# Patient Record
Sex: Male | Born: 1940 | Race: Black or African American | Hispanic: No | Marital: Married | Smoking: Former smoker
Health system: Southern US, Community
[De-identification: ages and names within clinical notes are randomized; demographics above are authoritative.]

## PROBLEM LIST (undated history)

## (undated) DIAGNOSIS — I499 Cardiac arrhythmia, unspecified: Secondary | ICD-10-CM

## (undated) DIAGNOSIS — I10 Essential (primary) hypertension: Secondary | ICD-10-CM

## (undated) DIAGNOSIS — E119 Type 2 diabetes mellitus without complications: Secondary | ICD-10-CM

## (undated) HISTORY — PX: CHOLECYSTECTOMY: SHX55

## (undated) HISTORY — PX: COLONOSCOPY: SHX174

---

## 2004-08-08 ENCOUNTER — Emergency Department: Payer: Self-pay | Admitting: Emergency Medicine

## 2007-05-30 ENCOUNTER — Ambulatory Visit: Payer: Self-pay | Admitting: Unknown Physician Specialty

## 2009-08-09 ENCOUNTER — Emergency Department: Payer: Self-pay | Admitting: Internal Medicine

## 2009-08-16 ENCOUNTER — Ambulatory Visit: Payer: Self-pay | Admitting: Internal Medicine

## 2012-01-16 ENCOUNTER — Ambulatory Visit: Payer: Self-pay | Admitting: Internal Medicine

## 2012-01-28 ENCOUNTER — Ambulatory Visit: Payer: Self-pay

## 2012-04-23 ENCOUNTER — Other Ambulatory Visit: Payer: Self-pay | Admitting: Neurosurgery

## 2012-04-23 DIAGNOSIS — M792 Neuralgia and neuritis, unspecified: Secondary | ICD-10-CM

## 2012-05-08 ENCOUNTER — Ambulatory Visit
Admission: RE | Admit: 2012-05-08 | Discharge: 2012-05-08 | Disposition: A | Discharge status: Home / Self Care | Payer: Medicare Other | Source: Ambulatory Visit | Attending: Neurosurgery | Admitting: Neurosurgery

## 2012-05-08 ENCOUNTER — Other Ambulatory Visit: Payer: Self-pay | Admitting: Neurosurgery

## 2012-05-08 ENCOUNTER — Inpatient Hospital Stay
Admission: RE | Admit: 2012-05-08 | Discharge: 2012-05-08 | Disposition: A | Discharge status: Home / Self Care | Payer: Self-pay | Source: Ambulatory Visit | Attending: Neurosurgery | Admitting: Neurosurgery

## 2012-05-08 VITALS — BP 111/67 | HR 76 | Ht 69.0 in | Wt 170.0 lb

## 2012-05-08 DIAGNOSIS — M792 Neuralgia and neuritis, unspecified: Secondary | ICD-10-CM

## 2012-05-08 DIAGNOSIS — R52 Pain, unspecified: Secondary | ICD-10-CM

## 2012-05-08 MED ORDER — IOHEXOL 180 MG/ML  SOLN
15.0000 mL | Freq: Once | INTRAMUSCULAR | Status: AC | PRN
  Administered 2012-05-08: 15 mL via INTRATHECAL

## 2012-05-08 MED ORDER — DIAZEPAM 5 MG PO TABS
5.0000 mg | ORAL_TABLET | Freq: Once | ORAL | Status: AC
  Administered 2012-05-08: 5 mg via ORAL

## 2012-05-08 NOTE — Progress Notes (Signed)
Resting quietly without complaint with wife at bedside.  Donell Sievert, RN

## 2014-03-06 ENCOUNTER — Emergency Department: Payer: Self-pay | Admitting: Student

## 2014-04-01 ENCOUNTER — Ambulatory Visit: Payer: Self-pay | Admitting: Gastroenterology

## 2015-05-17 ENCOUNTER — Ambulatory Visit
Admission: EM | Admit: 2015-05-17 | Discharge: 2015-05-17 | Disposition: A | Discharge status: Home / Self Care | Payer: Medicare Other | Attending: Family Medicine | Admitting: Family Medicine

## 2015-05-17 ENCOUNTER — Encounter: Payer: Self-pay | Admitting: Emergency Medicine

## 2015-05-17 DIAGNOSIS — B029 Zoster without complications: Secondary | ICD-10-CM

## 2015-05-17 MED ORDER — MUPIROCIN 2 % EX OINT: 1.0000 "application " | TOPICAL_OINTMENT | Freq: Three times a day (TID) | CUTANEOUS | Status: DC

## 2015-05-17 MED ORDER — FAMCICLOVIR 500 MG PO TABS: 500.0000 mg | ORAL_TABLET | Freq: Three times a day (TID) | ORAL | Status: DC

## 2015-05-17 NOTE — ED Notes (Signed)
Pt reports since last wed. And redness on right side of face started in hairline, and now down temporal area near eye. Also woke up today with eye itching and drainage. Denies blurred vision. Took some benadryl .

## 2015-05-17 NOTE — ED Provider Notes (Signed)
CSN: [id number]     Arrival date & time 05/17/15  [id number] History   First MD Initiated Contact with Patient 05/17/15 [phone]     Chief Complaint  Patient presents with  . Eye Drainage   (Consider location/radiation/quality/duration/timing/severity/associated sxs/prior Treatment) HPI   This 74 year old male who presents with a six-day history of right sided redness started in his scalp and has now extended down into the temporal area near his eye and also on the medial canthus upper eyelid. States that it is not been overly painful but has had some itching with some drainage. He does not have any visual disturbances in his visual acuity today is normal. The eye does have some redness to the conjunctiva and he has complained of some matting when he awakes in the morning. He does have some crustiness over the vesicular lesions over his temple which is a large patch. Is no fever or chills.  History reviewed. No pertinent past medical history. History reviewed. No pertinent past surgical history. History reviewed. No pertinent family history. Social History  Substance Use Topics  . Smoking status: None  . Smokeless tobacco: None  . Alcohol Use: None    Review of Systems  Constitutional: Negative for fever, chills, activity change and fatigue.  HENT: Positive for facial swelling.   Eyes: Positive for discharge, redness and itching. Negative for photophobia and visual disturbance.  Skin: Positive for rash.  All other systems reviewed and are negative.   Allergies  Review of patient's allergies indicates no known allergies.  Home Medications   Prior to Admission medications   Medication Sig Start Date End Date Taking? Authorizing Provider  Liraglutide (VICTOZA Elsie) Inject into the skin.   Yes Historical Provider, MD  LISINOPRIL PO Take by mouth.   Yes Historical Provider, MD  famciclovir (FAMVIR) 500 MG tablet Take 1 tablet (500 mg total) by mouth 3 (three) times daily. 05/17/15   Lutricia FeilWilliam P  Damean Poffenberger, PA-C  mupirocin ointment (BACTROBAN) 2 % Apply 1 application topically 3 (three) times daily. 05/17/15   Lutricia FeilWilliam P Anyiah Coverdale, PA-C   Meds Ordered and Administered this Visit  Medications - No data to display  BP 134/89 mmHg  Pulse 70  Temp(Src) 98.6 F (37 C) (Oral)  Resp 16  Ht 5\' 9"  (1.753 m)  Wt 180 lb (81.647 kg)  BMI 26.57 kg/m2  SpO2 99% No data found.   Physical Exam  Constitutional: He is oriented to person, place, and time. He appears well-developed and well-nourished. No distress.  HENT:  Head: Normocephalic and atraumatic.  Right Ear: External ear normal.  Left Ear: External ear normal.  Examination of the face shows a vesicular rash with erythema on the base to near the temporal patch starting from his scalp. Midline over the right side of the hairline to the temporal area and over the lateral aspect of the eye over the medial canthus of the eye lid. No direct involvement seen involving the eye; the eye is bloodshot without any noticeable discharge. The patch of vesicular rash over the temporal area does have a very large erythematous base that and some honey-colored discharge of the vesicles probably indicating a secondary infection.  Eyes: Pupils are equal, round, and reactive to light.  Neck: Normal range of motion. Neck supple.  Pulmonary/Chest: Effort normal and breath sounds normal. No respiratory distress.  Musculoskeletal: Normal range of motion. He exhibits no edema or tenderness.  Neurological: He is alert and oriented to person, place, and time.  Skin: Skin  is warm and dry. He is not diaphoretic.  Psychiatric: He has a normal mood and affect. His behavior is normal. Judgment and thought content normal.  Nursing note and vitals reviewed.   ED Course  Procedures (including critical care time)  Labs Review Labs Reviewed - No data to display  Imaging Review No results found.   Visual Acuity Review  Right Eye Distance: 20/30 Left Eye  Distance: 20/40 Bilateral Distance: 20/30 -1  Right Eye Near:   Left Eye Near:    Bilateral Near:         MDM   1. Shingles    Discharge Medication List as of 05/17/2015  9:15 AM     Plan: 1. Test/x-ray results and diagnosis reviewed with patient 2. rx as per orders; risks, benefits, potential side effects reviewed with patient 3. Recommend supportive treatment with pain meds ( patient refused narcotic). I arranged to have the patient seen at [facility] at 10:00 am this AM. Decided to start the patient on Famvir and Bactroban ointment to the vesicles that have a secondary infection. I've advised him that he needs to follow-up with his primary care physician this week if at all possible  4. F/u prn if symptoms worsen or don't improve    Lutricia Feil, PA-C 05/17/15 [phone]

## 2016-04-25 ENCOUNTER — Telehealth: Payer: Self-pay | Admitting: Gastroenterology

## 2016-04-25 NOTE — Telephone Encounter (Signed)
colonoscopy

## 2016-04-26 NOTE — Telephone Encounter (Signed)
Patient returning your call regarding a colonoscopy °

## 2016-04-27 ENCOUNTER — Telehealth: Payer: Self-pay

## 2016-04-27 ENCOUNTER — Other Ambulatory Visit: Payer: Self-pay

## 2016-04-27 NOTE — Telephone Encounter (Signed)
Gastroenterology Pre-Procedure Review  Request Date:  Requesting Physician: Dr.   PATIENT REVIEW QUESTIONS: The patient responded to the following health history questions as indicated:    1. Are you having any GI issues? no 2. Do you have a personal history of Polyps? no 3. Do you have a family history of Colon Cancer or Polyps? yes (Father colon cancer) 4. Diabetes Mellitus? yes (type 2) 5. Joint replacements in the past 12 months?no 6. Major health problems in the past 3 months?no 7. Any artificial heart valves, MVP, or defibrillator?no    MEDICATIONS & ALLERGIES:    Patient reports the following regarding taking any anticoagulation/antiplatelet therapy:   Plavix, Coumadin, Eliquis, Xarelto, Lovenox, Pradaxa, Brilinta, or Effient? no Aspirin? no  Patient confirms/reports the following medications:  Current Outpatient Prescriptions  Medication Sig Dispense Refill  . famciclovir (FAMVIR) 500 MG tablet Take 1 tablet (500 mg total) by mouth 3 (three) times daily. 21 tablet 0  . Liraglutide (VICTOZA Kempton) Inject into the skin.    Marland Kitchen. LISINOPRIL PO Take by mouth.    . mupirocin ointment (BACTROBAN) 2 % Apply 1 application topically 3 (three) times daily. 22 g 0   No current facility-administered medications for this visit.     Patient confirms/reports the following allergies:  No Known Allergies  No orders of the defined types were placed in this encounter.   AUTHORIZATION INFORMATION Primary Insurance: 1D#: Group #:  Secondary Insurance: 1D#: Group #:  SCHEDULE INFORMATION: Date: 05/17/16 Time: Location: [facility]

## 2016-04-27 NOTE — Telephone Encounter (Signed)
[facility]ohl.   Screening Z12.11 and family hx of colon cancer Z80.0  Please precert.

## 2016-05-01 NOTE — Telephone Encounter (Signed)
Patient has medicare. Pre cert is not required

## 2016-05-10 ENCOUNTER — Other Ambulatory Visit: Payer: Self-pay

## 2016-05-10 ENCOUNTER — Encounter: Payer: Self-pay | Admitting: *Deleted

## 2016-05-16 ENCOUNTER — Telehealth: Payer: Self-pay | Admitting: Gastroenterology

## 2016-05-16 NOTE — Telephone Encounter (Signed)
Patient needs to reschedule his procedure  

## 2016-05-16 NOTE — Telephone Encounter (Signed)
Funeral tomorrow so call Friday or next week to reschedule

## 2016-05-16 NOTE — Telephone Encounter (Signed)
Contacted pt and rescheduled colonoscopy to 06/01/16. Left vm with [facility] for this appt to be rescheduled to new date.

## 2016-05-31 NOTE — Discharge Instructions (Signed)

## 2016-06-01 ENCOUNTER — Ambulatory Visit: Payer: Medicare HMO | Admitting: Student in an Organized Health Care Education/Training Program

## 2016-06-01 ENCOUNTER — Ambulatory Visit
Admission: RE | Admit: 2016-06-01 | Discharge: 2016-06-01 | Disposition: A | Discharge status: Home / Self Care | Payer: Medicare HMO | Source: Ambulatory Visit | Attending: Gastroenterology | Admitting: Gastroenterology

## 2016-06-01 ENCOUNTER — Encounter
Admission: RE | Disposition: A | Discharge status: Home / Self Care | Payer: Self-pay | Source: Ambulatory Visit | Attending: Gastroenterology

## 2016-06-01 DIAGNOSIS — Z79899 Other long term (current) drug therapy: Secondary | ICD-10-CM | POA: Diagnosis not present

## 2016-06-01 DIAGNOSIS — Z87891 Personal history of nicotine dependence: Secondary | ICD-10-CM | POA: Diagnosis not present

## 2016-06-01 DIAGNOSIS — E119 Type 2 diabetes mellitus without complications: Secondary | ICD-10-CM | POA: Insufficient documentation

## 2016-06-01 DIAGNOSIS — K5730 Diverticulosis of large intestine without perforation or abscess without bleeding: Secondary | ICD-10-CM | POA: Diagnosis not present

## 2016-06-01 DIAGNOSIS — I10 Essential (primary) hypertension: Secondary | ICD-10-CM | POA: Diagnosis not present

## 2016-06-01 DIAGNOSIS — Z7984 Long term (current) use of oral hypoglycemic drugs: Secondary | ICD-10-CM | POA: Diagnosis not present

## 2016-06-01 DIAGNOSIS — Z1211 Encounter for screening for malignant neoplasm of colon: Secondary | ICD-10-CM

## 2016-06-01 DIAGNOSIS — K641 Second degree hemorrhoids: Secondary | ICD-10-CM | POA: Diagnosis not present

## 2016-06-01 HISTORY — DX: Cardiac arrhythmia, unspecified: I49.9

## 2016-06-01 HISTORY — DX: Essential (primary) hypertension: I10

## 2016-06-01 HISTORY — PX: COLONOSCOPY WITH PROPOFOL: SHX5780

## 2016-06-01 HISTORY — DX: Type 2 diabetes mellitus without complications: E11.9

## 2016-06-01 LAB — GLUCOSE, CAPILLARY
GLUCOSE-CAPILLARY: 109 mg/dL — AB (ref 65–99)
GLUCOSE-CAPILLARY: 108 mg/dL — AB (ref 65–99)

## 2016-06-01 SURGERY — COLONOSCOPY WITH PROPOFOL
Anesthesia: Monitor Anesthesia Care | Wound class: Contaminated

## 2016-06-01 MED ORDER — LIDOCAINE HCL (CARDIAC) 20 MG/ML IV SOLN
INTRAVENOUS | Status: DC | PRN
  Administered 2016-06-01: 40 mg via INTRAVENOUS

## 2016-06-01 MED ORDER — OXYCODONE HCL 5 MG PO TABS: 5.0000 mg | ORAL_TABLET | Freq: Once | ORAL | Status: DC | PRN

## 2016-06-01 MED ORDER — OXYCODONE HCL 5 MG/5ML PO SOLN
5.0000 mg | Freq: Once | ORAL | Status: DC | PRN
Start: 2016-06-01 — End: 2016-06-01

## 2016-06-01 MED ORDER — PROPOFOL 10 MG/ML IV BOLUS
INTRAVENOUS | Status: DC | PRN
  Administered 2016-06-01: 100 mg via INTRAVENOUS
  Administered 2016-06-01 (×2): 50 mg via INTRAVENOUS

## 2016-06-01 MED ORDER — LACTATED RINGERS IV SOLN
INTRAVENOUS | Status: DC
  Administered 2016-06-01: 08:00:00 via INTRAVENOUS

## 2016-06-01 MED ORDER — STERILE WATER FOR IRRIGATION IR SOLN
Status: DC | PRN
  Administered 2016-06-01: 08:00:00

## 2016-06-01 SURGICAL SUPPLY — 23 items

## 2016-06-01 NOTE — Anesthesia Preprocedure Evaluation (Signed)
Anesthesia Evaluation  Patient identified by MRN, date of birth, ID band Patient awake    Reviewed: Allergy & Precautions, H&P , NPO status , Patient's Chart, lab work & pertinent test results  Airway Mallampati: II  TM Distance: >3 FB Neck ROM: full    Dental   Pulmonary former smoker,    Pulmonary exam normal        Cardiovascular hypertension, Normal cardiovascular exam     Neuro/Psych    GI/Hepatic negative GI ROS, Neg liver ROS,   Endo/Other  diabetes  Renal/GU negative Renal ROS     Musculoskeletal   Abdominal   Peds  Hematology negative hematology ROS (+)   Anesthesia Other Findings   Reproductive/Obstetrics negative OB ROS                             Anesthesia Physical Anesthesia Plan  ASA: II  Anesthesia Plan: MAC   Post-op Pain Management:    Induction:   Airway Management Planned:   Additional Equipment:   Intra-op Plan:   Post-operative Plan:   Informed Consent:   Plan Discussed with:   Anesthesia Plan Comments:         Anesthesia Quick Evaluation

## 2016-06-01 NOTE — H&P (Signed)
  Midge Miniumarren Pax Reasoner, MD [facility] [address] [place], [place] [address] Phone: [phone] Fax : [phone]  Primary Care Physician:  No primary care provider on file. Primary Gastroenterologist:  Dr. Servando SnareWohl  Pre-Procedure History & Physical: HPI:  Jordan Jennings is a 76 y.o. male is here for a screening colonoscopy.   Past Medical History:  Diagnosis Date  . Diabetes mellitus without complication (HCC)   . 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 Hypertension     Past Surgical History:  Procedure Laterality Date  . CHOLECYSTECTOMY    . COLONOSCOPY      Prior to Admission medications   Medication Sig Start Date End Date Taking? Authorizing Provider  glipiZIDE (GLUCOTROL XL) 5 MG 24 hr tablet 2 (two) times daily.  04/13/16  Yes Historical Provider, MD  Insulin Degludec (TRESIBA FLEXTOUCH Kirvin) Inject into the skin daily.   Yes Historical Provider, MD  lisinopril-hydrochlorothiazide (PRINZIDE,ZESTORETIC) 20-25 MG tablet Take 1 tablet by mouth daily.   Yes Historical Provider, MD  PROPRANOLOL HCL PO Take 10 mg by mouth 3 (three) times daily.    Yes Historical Provider, MD    Allergies as of 04/27/2016  . (No Known Allergies)    History reviewed. No pertinent family history.  Social History   Social History  . Marital status: Married    Spouse name: N/A  . Number of children: N/A  . Years of education: N/A   Occupational History  . Not on file.   Social History Main Topics  . Smoking status: Former Games developermoker  . Smokeless tobacco: Never Used     Comment: as teenager  . Alcohol use Yes     Comment: Holidays  . Drug use: Unknown  . Sexual activity: Not on file   Other Topics Concern  . Not on file   Social History Narrative  . No narrative on file    Review of Systems: See HPI, otherwise negative ROS  Physical Exam: BP (!) 141/94   Pulse 80   Temp 97 F (36.1 C) (Temporal)   Ht 5\' 9"  (1.753 m)   Wt 184 lb (83.5 kg)   SpO2 99%   BMI  27.17 kg/m  General:   Alert,  pleasant and cooperative in NAD Head:  Normocephalic and atraumatic. Neck:  Supple; no masses or thyromegaly. Lungs:  Clear throughout to auscultation.    Heart:  Regular rate and rhythm. Abdomen:  Soft, nontender and nondistended. Normal bowel sounds, without guarding, and without rebound.   Neurologic:  Alert and  oriented x4;  grossly normal neurologically.  Impression/Plan: Jordan Jennings is now here to undergo a screening colonoscopy.  Risks, benefits, and alternatives regarding colonoscopy have been reviewed with the patient.  Questions have been answered.  All parties agreeable.

## 2016-06-01 NOTE — Op Note (Addendum)
[facility] Gastroenterology Patient Name: Jordan BourbonLorenza Jennings Procedure Date: 06/01/2016 7:53 AM MRN: [id number] Account #: [id number] Date of Birth: 03/27/1941 Admit Type: Outpatient Age: 76 [facility] Gender: Male Note Status: Finalized Procedure:            Colonoscopy Indications:          Screening for colorectal malignant neoplasm Providers:            Midge Miniumarren Yeimi Debnam MD, MD Referring MD:         Sherrie MustacheFayegh Jadali, MD (Referring MD) Medicines:            Propofol per Anesthesia Complications:        No immediate complications. Procedure:            Pre-Anesthesia Assessment:                       - Prior to the procedure, a History and Physical was                        performed, and patient medications and allergies were                        reviewed. The patient's tolerance of previous                        anesthesia was also reviewed. The risks and benefits of                        the procedure and the sedation options and risks were                        discussed with the patient. All questions were                        answered, and informed consent was obtained. Prior                        Anticoagulants: The patient has taken no previous                        anticoagulant or antiplatelet agents. ASA Grade                        Assessment: II - A patient with mild systemic disease.                        After reviewing the risks and benefits, the patient was                        deemed in satisfactory condition to undergo the                        procedure.                       After obtaining informed consent, the colonoscope was                        passed under direct vision. Throughout the procedure,  the patient's blood pressure, pulse, and oxygen                        saturations were monitored continuously. The Olympus CF                        H180AL colonoscope (S#: [id number]) was introduced through                       the anus and advanced to the the cecum, identified by                        appendiceal orifice and ileocecal valve. The                        colonoscopy was performed without difficulty. The                        patient tolerated the procedure well. The quality of                        the bowel preparation was good. Findings:      The perianal and digital rectal examinations were normal.      Multiple small-mouthed diverticula were found in the entire colon.      Non-bleeding internal hemorrhoids were found during retroflexion. The       hemorrhoids were Grade II (internal hemorrhoids that prolapse but reduce       spontaneously). Impression:           - Diverticulosis in the entire examined colon.                       - Non-bleeding internal hemorrhoids.                       - No specimens collected. Recommendation:       - Discharge patient to home.                       - Resume previous diet.                       - Continue present medications.                       - Repeat colonoscopy in 10 years for screening unless                        any change in family history or lower GI problems. Procedure Code(s):    --- Professional ---                       [phone], Colonoscopy, flexible; diagnostic, including                        collection of specimen(s) by brushing or washing, when                        performed (separate procedure) Diagnosis Code(s):    --- Professional ---                       Z12.11,  Encounter for screening for malignant neoplasm                        of colon CPT copyright 2016 American Medical Association. All rights reserved. The codes documented in this report are preliminary and upon coder review may  be revised to meet current compliance requirements. Midge Minium MD, MD 06/01/2016 8:13:21 AM This report has been signed electronically. Number of Addenda: 0 Note Initiated On: 06/01/2016 7:53 AM Scope Withdrawal Time: 0  hours 7 minutes 14 seconds  Total Procedure Duration: 0 hours 8 minutes 31 seconds       [facility]

## 2016-06-01 NOTE — Anesthesia Postprocedure Evaluation (Signed)
Anesthesia Post Note  Patient: Jordan Jennings  Procedure(s) Performed: Procedure(s) (LRB): COLONOSCOPY WITH PROPOFOL (N/A)  Patient location during evaluation: PACU Anesthesia Type: MAC Level of consciousness: awake Pain management: pain level controlled Vital Signs Assessment: post-procedure vital signs reviewed and stable Respiratory status: spontaneous breathing Cardiovascular status: blood pressure returned to baseline Postop Assessment: no headache Anesthetic complications: no    Jaci Standard, III,  Kingjames Coury D

## 2016-06-01 NOTE — Anesthesia Procedure Notes (Signed)
Procedure Name: MAC Date/Time: 06/01/2016 7:56 AM Performed by: Janna Arch Pre-anesthesia Checklist: Patient identified, Emergency Drugs available, Suction available and Patient being monitored Patient Re-evaluated:Patient Re-evaluated prior to inductionOxygen Delivery Method: Nasal cannula

## 2016-06-01 NOTE — Transfer of Care (Signed)
Immediate Anesthesia Transfer of Care Note  Patient: Jordan Jennings  Procedure(s) Performed: Procedure(s) with comments: COLONOSCOPY WITH PROPOFOL (N/A) - Diabetic  Patient Location: PACU  Anesthesia Type: MAC  Level of Consciousness: awake, alert  and patient cooperative  Airway and Oxygen Therapy: Patient Spontanous Breathing and Patient connected to supplemental oxygen  Post-op Assessment: Post-op Vital signs reviewed, Patient's Cardiovascular Status Stable, Respiratory Function Stable, Patent Airway and No signs of Nausea or vomiting  Post-op Vital Signs: Reviewed and stable  Complications: No apparent anesthesia complications

## 2016-06-04 ENCOUNTER — Encounter: Payer: Self-pay | Admitting: Gastroenterology

## 2019-05-05 ENCOUNTER — Other Ambulatory Visit: Payer: Self-pay | Admitting: Physician Assistant

## 2019-05-05 DIAGNOSIS — M5440 Lumbago with sciatica, unspecified side: Secondary | ICD-10-CM

## 2019-05-07 ENCOUNTER — Other Ambulatory Visit: Payer: Self-pay

## 2019-05-07 ENCOUNTER — Encounter: Payer: Self-pay | Admitting: Emergency Medicine

## 2019-05-07 ENCOUNTER — Ambulatory Visit
Admission: EM | Admit: 2019-05-07 | Discharge: 2019-05-07 | Disposition: A | Discharge status: Home / Self Care | Payer: Medicare HMO | Attending: Family Medicine | Admitting: Family Medicine

## 2019-05-07 DIAGNOSIS — R058 Other specified cough: Secondary | ICD-10-CM

## 2019-05-07 DIAGNOSIS — Z87891 Personal history of nicotine dependence: Secondary | ICD-10-CM | POA: Diagnosis not present

## 2019-05-07 DIAGNOSIS — Z20828 Contact with and (suspected) exposure to other viral communicable diseases: Secondary | ICD-10-CM

## 2019-05-07 DIAGNOSIS — R05 Cough: Secondary | ICD-10-CM

## 2019-05-07 DIAGNOSIS — Z20822 Contact with and (suspected) exposure to covid-19: Secondary | ICD-10-CM

## 2019-05-07 DIAGNOSIS — Z7189 Other specified counseling: Secondary | ICD-10-CM

## 2019-05-07 NOTE — Discharge Instructions (Addendum)
It was very nice seeing you today in clinic. Thank you for entrusting me with your care.   Rest and stay HYDRATED. Water and electrolyte containing beverages (Gatorade, Pedialyte) are best to prevent dehydration and electrolyte abnormalities.   May use Tylenol and/or Ibuprofen as needed for pain/fever.   Continue over the counter cough syrup and daily allergy medication.   You were tested for SARS-CoV-2 (novel coronavirus) today. Testing is performed by an outside lab ([facility]) and has variable turn around times ranging between 2-5 days. Current recommendations from the the [facility] and [facility] DHHS require that you remain at home until negative test results are have been received. In the event that your test results are positive, you will be contacted with further directives. These measures are being implemented out of an abundance of caution to prevent transmission and spread during the current SARS-CoV-2 pandemic.  Make arrangements to follow up with your regular doctor in 1 week for re-evaluation if not improving. If your symptoms/condition worsens, please seek follow up care either here or in the ER. Please remember, our [facility] providers are "right here with you" when you need [place].   Again, it was my pleasure to take care of you today. Thank you for choosing our clinic. I hope that you start to feel better quickly.   Honor Loh, MSN, APRN, FNP-C, CEN Advanced Practice Provider [facility]

## 2019-05-07 NOTE — ED Triage Notes (Signed)
Pt c/o cough. Started about 2 weeks ago. He states that he seems to be getting better but his wife tested positive for covid 4 days ago.

## 2019-05-07 NOTE — ED Provider Notes (Addendum)
Jordan Jennings, Jordan Jennings   Name: Jordan OrLorenza R Dittmar DOB: 08/13/1940 MRN: [id number] CSN: [id number] PCP: Patient, No Pcp Per  Arrival date and time:  05/07/19 [id number]  Chief Complaint:  Cough and COVID exposure   NOTE: Prior to seeing the patient today, I have reviewed the triage nursing documentation and vital signs. Clinical staff has updated patient's PMH/PSHx, current medication list, and drug allergies/intolerances to ensure comprehensive history available to assist in medical decision making.   History:   HPI: Jordan Jennings is a 78 y.o. male who presents today with complaints of cough and post nasal drip that started approximately 2 weeks ago. Patient denies fevers. Cough is intermittently productive of clear sputum. He denies any shortness of breath or wheezing. PMH (+) for seasonal allergic rhinitis; takes daily allergy medication (unknown medication). He denies that he has experienced any nausea, vomiting, diarrhea, or abdominal pain. He is eating and drinking well. Patient denies any perceived alterations to his sense of taste or smell. Patient presents out of concerns for his personal health following his his wife testing positive for SARS-CoV-2 (novel coronavirus) on Monday. Patient reports that he felt like his cough has improved since its onset, however he just wants to make sure that he does not have SARS-CoV-2. He has never been tested for SARS-CoV-2 (novel coronavirus) in the past per his report. Patient has been vaccinated for influenza this season. In efforts to conservatively manage his symptoms at home, the patient notes that he has used Robitussin, which has helped to improve his symptoms.   Past Medical History:  Diagnosis Date  . Diabetes mellitus without complication (HCC)   . 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 Hypertension     Past Surgical History:  Procedure Laterality Date  . CHOLECYSTECTOMY    . COLONOSCOPY    . COLONOSCOPY WITH PROPOFOL N/A 06/01/2016   Procedure: COLONOSCOPY WITH PROPOFOL;  Surgeon: Midge Miniumarren Wohl, MD;  [facility];  Service: Endoscopy;  Laterality: N/A;  Diabetic    History reviewed. No pertinent family history.  Social History   Tobacco Use  . Smoking status: Former Games developermoker  . Smokeless tobacco: Never Used  . Tobacco comment: as teenager  Substance Use Topics  . Alcohol use: Yes    Comment: Holidays  . Drug use: Not on file    Patient Active Problem List   Diagnosis Date Noted  . Special screening for malignant neoplasms, colon     Home Medications:    Current Meds  Medication Sig  . glipiZIDE (GLUCOTROL XL) 5 MG 24 hr tablet 2 (two) times daily.   . Insulin Degludec (TRESIBA FLEXTOUCH [place]) Inject into the skin daily.  Marland Kitchen. lisinopril-hydrochlorothiazide (PRINZIDE,ZESTORETIC) 20-25 MG tablet Take 1 tablet by mouth daily.  Marland Kitchen. PROPRANOLOL HCL PO Take 10 mg by mouth 3 (three) times daily.   . Vitamin D, Ergocalciferol, (DRISDOL) 1.25 MG (50000 UT) CAPS capsule Take 50,000 Units by mouth every 7 (seven) days.    Allergies:   Patient has no known allergies.  Review of Systems (ROS): Review of Systems  Constitutional: Negative for fatigue and fever.  HENT: Positive for postnasal drip. Negative for congestion, ear pain, rhinorrhea, sinus pressure, sinus pain, sneezing and sore throat.   Eyes: Negative for pain, discharge and redness.  Respiratory: Positive for cough. Negative for chest tightness, shortness of breath, wheezing and stridor.   Cardiovascular: Negative for chest pain and palpitations.  Gastrointestinal: Negative for abdominal pain, diarrhea, nausea and vomiting.  Musculoskeletal: Negative for arthralgias, back pain, myalgias and neck pain.  Skin: Negative for color change, pallor and rash.  Allergic/Immunologic: Positive for environmental allergies (seasonal).  Neurological: Negative for dizziness, syncope, weakness and headaches.  Hematological: Negative for adenopathy.     Vital  Signs: Today's Vitals   05/07/19 0956 05/07/19 0957 05/07/19 1001  BP:   (!) 150/90  Pulse:   (!) 59  Resp:   18  Temp:   98.2 F (36.8 C)  TempSrc:   Oral  SpO2:   98%  Weight:  193 lb (87.5 kg)   Height:  5\' 9"  (1.753 m)   PainSc: 0-No pain      Physical Exam: Physical Exam  Constitutional: He is oriented to person, place, and time and well-developed, well-nourished, and in no distress.  HENT:  Head: Normocephalic and atraumatic.  Right Ear: Tympanic membrane normal.  Left Ear: Tympanic membrane normal.  Nose: Nose normal.  Mouth/Throat: Uvula is midline. Posterior oropharyngeal erythema (mild with (+) clear PND) present. No oropharyngeal exudate.  Eyes: Pupils are equal, round, and reactive to light.  Cardiovascular: Normal rate, regular rhythm, normal heart sounds and intact distal pulses.  Pulmonary/Chest: Effort normal and breath sounds normal.  Neurological: He is alert and oriented to person, place, and time. Gait normal.  Skin: Skin is warm and dry. No rash noted. He is not diaphoretic.  Psychiatric: Mood, memory, affect and judgment normal.  Nursing note and vitals reviewed.   Urgent Care Treatments / Results:  LABS: PLEASE NOTE: all labs that were ordered this encounter are listed, however only abnormal results are displayed. Labs Reviewed  NOVEL CORONAVIRUS, NAA (HOSP ORDER, SEND-OUT TO REF LAB; TAT 18-24 HRS)    EKG: -None  RADIOLOGY: No results found.  PROCEDURES: Procedures  MEDICATIONS RECEIVED THIS VISIT: Medications - No data to display  PERTINENT CLINICAL COURSE NOTES/UPDATES:   Initial Impression / Assessment and Plan / Urgent Care Course:  Pertinent labs & imaging results that were available during my care of the patient were personally reviewed by me and considered in my medical decision making (see lab/imaging section of note for values and interpretations).  Jordan Jennings is a 78 y.o. male who presents to [facility] today with  complaints of Cough and COVID exposure   Patient overall well appearing and in no acute distress today in clinic. Presenting symptoms (see HPI) and exam as documented above. He presents with symptoms associated with SARS-CoV-2 (novel coronavirus). Discussed typical symptom constellation. Reviewed potential for infection and need for testing. Patient amenable to being tested. SARS-CoV-2 swab collected by certified clinical staff. Discussed variable turn around times associated with testing, as swabs are being processed at [facility], and have been taking between 2-5 days to come back. He was advised to self quarantine, per [facility] DHHS guidelines, until negative results received. These measures are being implemented out of an abundance of caution to prevent transmission and spread during the current SARS-CoV-2 pandemic.  Presenting symptoms consistent with acute viral illness with cough. Until ruled out with confirmatory lab testing, SARS-CoV-2 remains part of the differential. His testing is pending at this time. I discussed with him that his symptoms are felt to be viral in nature, thus antibiotics would not offer him any relief or improve his symptoms any faster than conservative symptomatic management. Offered prescription for benzonatate, however patient declined citing that his cough had improved on his current interventions. Patient to continue antitussive syrup and daily allergy medication. Discussed supportive care  measures at home during acute phase of illness. Patient to rest as much as possible. He was encouraged to ensure adequate hydration (water and ORS) to prevent dehydration and electrolyte derangements. Patient may use APAP and/or IBU on an as needed basis for pain/fever.    Discussed follow up with primary care physician in 1 week for re-evaluation. I have reviewed the follow up and strict return precautions for any new or worsening symptoms. Patient is aware of symptoms that would be deemed  urgent/emergent, and would thus require further evaluation either here or in the emergency department. At the time of discharge, he verbalized understanding and consent with the discharge plan as it was reviewed with him. All questions were fielded by provider and/or clinic staff prior to patient discharge.    Final Clinical Impressions / Urgent Care Diagnoses:   Final diagnoses:  Cough with exposure to COVID-19 virus  Encounter for laboratory testing for COVID-19 virus  Advice given about COVID-19 virus infection    New Prescriptions:  [place] Controlled Substance Registry consulted? Not Applicable  No orders of the defined types were placed in this encounter.   Recommended Follow up Care:  Patient encouraged to follow up with the following provider within the specified time frame, or sooner as dictated by the severity of his symptoms. As always, he was instructed that for any urgent/emergent care needs, he should seek care either here or in the emergency department for more immediate evaluation.  Follow-up Information    PCP In 1 week.   Why: General reassessment of symptoms if not improving        NOTE: This note was prepared using Lobbyist along with smaller Company secretary. Despite my best ability to proofread, there is the potential that transcriptional errors may still occur from this process, and are completely unintentional.     Karen Kitchens, NP 05/07/19 1027

## 2019-05-08 LAB — NOVEL CORONAVIRUS, NAA (HOSP ORDER, SEND-OUT TO REF LAB; TAT 18-24 HRS): SARS-CoV-2, NAA: NOT DETECTED

## 2019-05-19 ENCOUNTER — Other Ambulatory Visit: Payer: Self-pay

## 2019-05-19 ENCOUNTER — Ambulatory Visit
Admission: RE | Admit: 2019-05-19 | Discharge: 2019-05-19 | Disposition: A | Discharge status: Home / Self Care | Payer: Medicare HMO | Source: Ambulatory Visit | Attending: Physician Assistant | Admitting: Physician Assistant

## 2019-05-19 DIAGNOSIS — M5440 Lumbago with sciatica, unspecified side: Secondary | ICD-10-CM

## 2019-05-19 MED ORDER — IOPAMIDOL (ISOVUE-M 200) INJECTION 41%
1.0000 mL | Freq: Once | INTRAMUSCULAR | Status: AC
  Administered 2019-05-19: 10:00:00 1 mL via EPIDURAL

## 2019-05-19 MED ORDER — METHYLPREDNISOLONE ACETATE 40 MG/ML INJ SUSP (RADIOLOG
120.0000 mg | Freq: Once | INTRAMUSCULAR | Status: AC
  Administered 2019-05-19: 120 mg via EPIDURAL

## 2019-05-19 NOTE — Discharge Instructions (Signed)

## 2019-07-13 ENCOUNTER — Ambulatory Visit: Payer: Medicare HMO | Attending: Internal Medicine

## 2019-07-13 DIAGNOSIS — Z23 Encounter for immunization: Secondary | ICD-10-CM

## 2019-07-13 NOTE — Progress Notes (Signed)
   Covid-19 Vaccination Clinic  Name:  Jordan Jennings    MRN: [id number] DOB: 03/14/1941  07/13/2019  [name] was observed post Covid-19 immunization for 15 minutes without incidence. He was provided with Vaccine Information Sheet and instruction to access the V-Safe system.   [name] was instructed to call 911 with any severe reactions post vaccine: Marland Kitchen Difficulty breathing  . Swelling of your face and throat  . A fast heartbeat  . A bad rash all over your body  . Dizziness and weakness    Immunizations Administered    Name Date Dose VIS Date Route   Moderna COVID-19 Vaccine 07/13/2019 11:08 AM 0.5 mL 04/21/2019 Intramuscular   Manufacturer: Moderna   Lot: [id number]   NDC: 14232-009-41

## 2019-08-11 ENCOUNTER — Ambulatory Visit: Payer: Medicare HMO | Attending: Internal Medicine

## 2019-08-11 DIAGNOSIS — Z23 Encounter for immunization: Secondary | ICD-10-CM

## 2019-08-11 NOTE — Progress Notes (Signed)
   Covid-19 Vaccination Clinic  Name:  Jordan Jennings    MRN: [id number] DOB: 1940/10/20  08/11/2019  [name] was observed post Covid-19 immunization for 15 minutes without incident. He was provided with Vaccine Information Sheet and instruction to access the V-Safe system.   [name] was instructed to call 911 with any severe reactions post vaccine: Marland Kitchen Difficulty breathing  . Swelling of face and throat  . A fast heartbeat  . A bad rash all over body  . Dizziness and weakness   Immunizations Administered    Name Date Dose VIS Date Route   Moderna COVID-19 Vaccine 08/11/2019 10:59 AM 0.5 mL 04/21/2019 Intramuscular   Manufacturer: Gala Murdoch   Lot: [id number]   NDC: 24818-590-93

## 2020-01-15 DIAGNOSIS — N182 Chronic kidney disease, stage 2 (mild): Secondary | ICD-10-CM | POA: Diagnosis present

## 2020-05-27 IMAGING — XA Imaging study
2 series · 2 of 2 positions shown · non-contrast
Comparison: none

CLINICAL DATA: Lumbosacral spondylosis without myelopathy. Right
buttock and right leg pain.

[Series 1: ortho standard · 1 of 1 slices shown (1 of 2)]
[im 1/1]
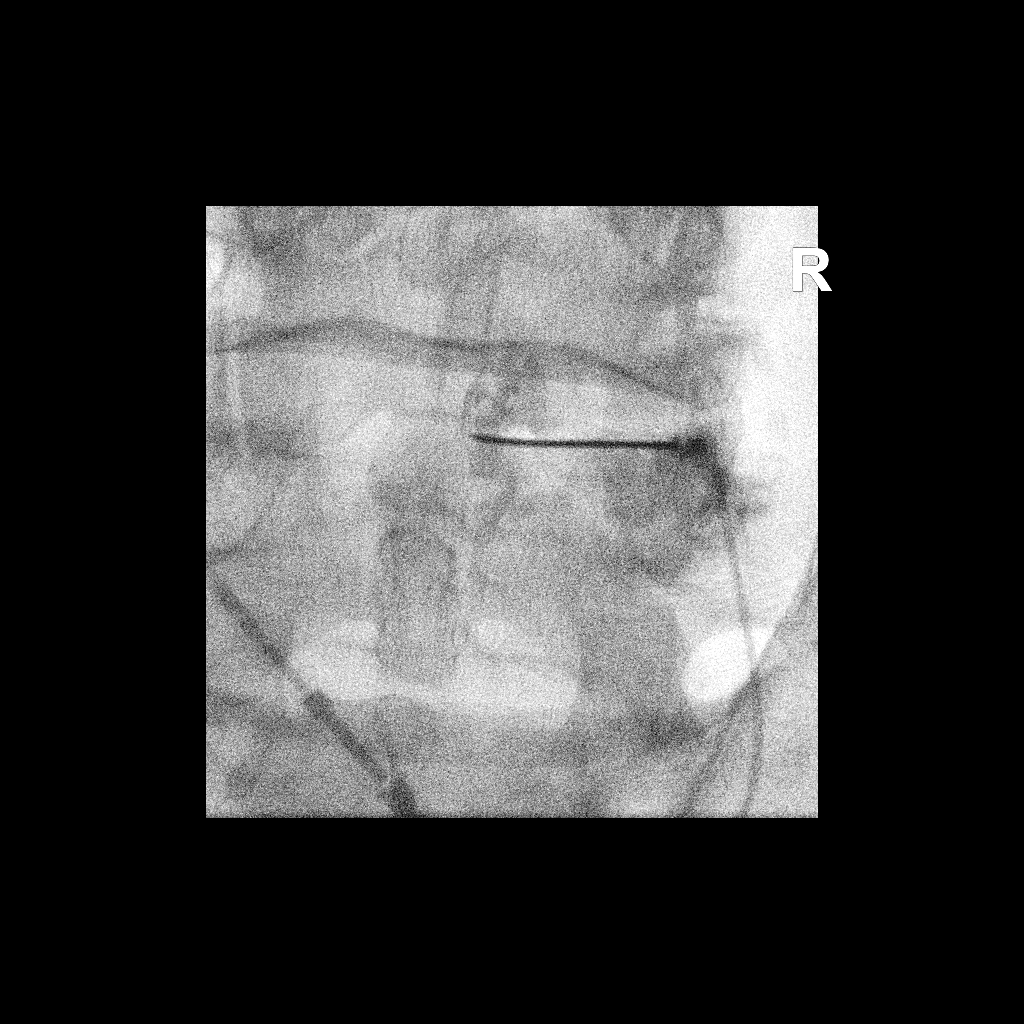

[Series 2: ortho standard · 1 of 1 slices shown (2 of 2)]
[im 1/1]
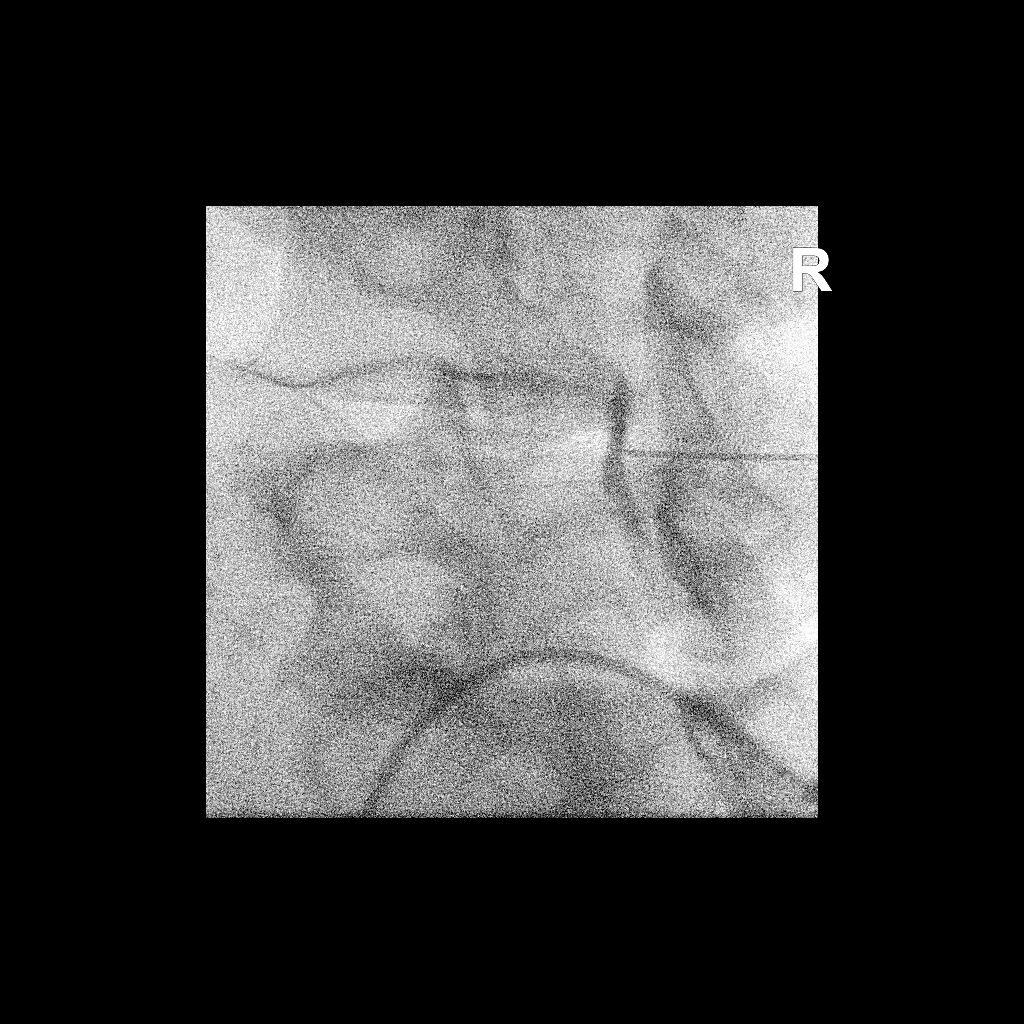

[2 of 2 positions shown; findings below may reference images not displayed]

FLUOROSCOPY TIME:  Fluoroscopy Time: 6 seconds

Radiation Exposure Index: 9.65 microGray*m^2

PROCEDURE:
The procedure, risks, benefits, and alternatives were explained to
the patient. Questions regarding the procedure were encouraged and
answered. The patient understands and consents to the procedure.

LUMBAR EPIDURAL INJECTION:

An interlaminar approach was performed on the right at L4-5. The
overlying skin was cleansed and anesthetized. A 3.5 inch 20 gauge
epidural needle was advanced using loss-of-resistance technique.

DIAGNOSTIC EPIDURAL INJECTION:

Injection of Isovue-M 200 shows a good epidural pattern with spread
above and below the level of needle placement, primarily on the
right. No vascular opacification is seen.

THERAPEUTIC EPIDURAL INJECTION:

120 mg of Depo-Medrol mixed with 3 mL of 1% lidocaine were
instilled. The procedure was well-tolerated, and the patient was
discharged thirty minutes following the injection in good condition.

COMPLICATIONS:
None
IMPRESSION: Technically successful interlaminar epidural injection on the right
at L4-5.

## 2020-06-08 DIAGNOSIS — E1129 Type 2 diabetes mellitus with other diabetic kidney complication: Secondary | ICD-10-CM | POA: Diagnosis not present

## 2020-07-09 DIAGNOSIS — E1129 Type 2 diabetes mellitus with other diabetic kidney complication: Secondary | ICD-10-CM | POA: Diagnosis not present

## 2020-07-14 DIAGNOSIS — E785 Hyperlipidemia, unspecified: Secondary | ICD-10-CM | POA: Diagnosis not present

## 2020-07-14 DIAGNOSIS — E1129 Type 2 diabetes mellitus with other diabetic kidney complication: Secondary | ICD-10-CM | POA: Diagnosis not present

## 2020-07-14 DIAGNOSIS — E1169 Type 2 diabetes mellitus with other specified complication: Secondary | ICD-10-CM | POA: Diagnosis not present

## 2020-07-14 DIAGNOSIS — Z794 Long term (current) use of insulin: Secondary | ICD-10-CM | POA: Diagnosis not present

## 2020-07-14 DIAGNOSIS — I152 Hypertension secondary to endocrine disorders: Secondary | ICD-10-CM | POA: Diagnosis not present

## 2020-07-14 DIAGNOSIS — E1159 Type 2 diabetes mellitus with other circulatory complications: Secondary | ICD-10-CM | POA: Diagnosis not present

## 2020-07-14 DIAGNOSIS — R809 Proteinuria, unspecified: Secondary | ICD-10-CM | POA: Diagnosis not present

## 2020-07-18 DIAGNOSIS — E1169 Type 2 diabetes mellitus with other specified complication: Secondary | ICD-10-CM | POA: Diagnosis not present

## 2020-07-18 DIAGNOSIS — E1122 Type 2 diabetes mellitus with diabetic chronic kidney disease: Secondary | ICD-10-CM | POA: Diagnosis not present

## 2020-07-18 DIAGNOSIS — E559 Vitamin D deficiency, unspecified: Secondary | ICD-10-CM | POA: Diagnosis not present

## 2020-07-18 DIAGNOSIS — Z0000 Encounter for general adult medical examination without abnormal findings: Secondary | ICD-10-CM | POA: Diagnosis not present

## 2020-07-18 DIAGNOSIS — N1831 Chronic kidney disease, stage 3a: Secondary | ICD-10-CM | POA: Diagnosis not present

## 2020-07-18 DIAGNOSIS — E785 Hyperlipidemia, unspecified: Secondary | ICD-10-CM | POA: Diagnosis not present

## 2020-07-18 DIAGNOSIS — I10 Essential (primary) hypertension: Secondary | ICD-10-CM | POA: Diagnosis not present

## 2020-07-18 DIAGNOSIS — Z794 Long term (current) use of insulin: Secondary | ICD-10-CM | POA: Diagnosis not present

## 2020-07-18 DIAGNOSIS — Z79899 Other long term (current) drug therapy: Secondary | ICD-10-CM | POA: Diagnosis not present

## 2020-07-25 DIAGNOSIS — E1169 Type 2 diabetes mellitus with other specified complication: Secondary | ICD-10-CM | POA: Diagnosis not present

## 2020-07-25 DIAGNOSIS — E785 Hyperlipidemia, unspecified: Secondary | ICD-10-CM | POA: Diagnosis not present

## 2020-07-25 DIAGNOSIS — Z79899 Other long term (current) drug therapy: Secondary | ICD-10-CM | POA: Diagnosis not present

## 2020-08-06 DIAGNOSIS — E1129 Type 2 diabetes mellitus with other diabetic kidney complication: Secondary | ICD-10-CM | POA: Diagnosis not present

## 2020-08-19 DIAGNOSIS — Z0100 Encounter for examination of eyes and vision without abnormal findings: Secondary | ICD-10-CM | POA: Diagnosis not present

## 2020-08-19 DIAGNOSIS — Z961 Presence of intraocular lens: Secondary | ICD-10-CM | POA: Diagnosis not present

## 2020-09-06 DIAGNOSIS — E1129 Type 2 diabetes mellitus with other diabetic kidney complication: Secondary | ICD-10-CM | POA: Diagnosis not present

## 2020-09-20 ENCOUNTER — Ambulatory Visit
Admission: EM | Admit: 2020-09-20 | Discharge: 2020-09-20 | Disposition: A | Discharge status: Home / Self Care | Payer: Medicare HMO | Attending: Sports Medicine | Admitting: Sports Medicine

## 2020-09-20 ENCOUNTER — Other Ambulatory Visit: Payer: Self-pay

## 2020-09-20 DIAGNOSIS — R1032 Left lower quadrant pain: Secondary | ICD-10-CM | POA: Diagnosis not present

## 2020-09-20 DIAGNOSIS — K5900 Constipation, unspecified: Secondary | ICD-10-CM | POA: Diagnosis not present

## 2020-09-20 NOTE — ED Triage Notes (Signed)
Pt c/o center abdominal pain with bloating. Took a laxative yesterday, regular bowel movement this morning. Denies urinary symptoms.

## 2020-09-20 NOTE — Discharge Instructions (Signed)
Please see educational handouts.  Use the laxative until you have loose stools.  Follow up with PCP if symptoms persist.  Go to ER if they get worse.

## 2020-09-23 NOTE — ED Provider Notes (Signed)
[facility]    CSN: [id number] Arrival date & time: 09/20/20  9622      History   Chief Complaint Chief Complaint  Patient presents with  . Abdominal Pain    HPI Jordan Jennings is a 80 y.o. male.   Patient is a pleasant 80 year old male who presents for evaluation of the above issue.  He normally sees Dr. Audelia Jennings at [facility] in [place].  He had an appointment today but he got a call and Dr. Audelia Jennings was out of the office and he felt as though he should be seen in the urgent care for this issue.  He reports some central and left lower quadrant discomfort which she describes as not really pain.  He says that he has some bloating.  He thinks as though he needed to have a good bowel movement.  He took a laxity at home and he had a regular BM this morning and his symptoms have improved but not back to his baseline.  He had not had a BM in several days and felt as though he was constipated.  No fever shakes chills.  No nausea vomiting or diarrhea.  No urinary symptoms.  No dysuria or hematuria.  He has no personal or family history of ulcerative colitis, ileitis, or diverticular disease.  No blood in the stool.  No chest pain or shortness of breath.  No red flag signs or symptoms elicited on history.     Past Medical History:  Diagnosis Date  . Diabetes mellitus without complication (HCC)   . 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 Hypertension     Patient Active Problem List   Diagnosis Date Noted  . Special screening for malignant neoplasms, colon     Past Surgical History:  Procedure Laterality Date  . CHOLECYSTECTOMY    . COLONOSCOPY    . COLONOSCOPY WITH PROPOFOL N/A 06/01/2016   Procedure: COLONOSCOPY WITH PROPOFOL;  Surgeon: Midge Minium, MD;  Location: [facility];  Service: Endoscopy;  Laterality: N/A;  Diabetic       Home Medications    Prior to Admission medications   Medication Sig Start Date End Date Taking? Authorizing  Provider  atorvastatin (LIPITOR) 10 MG tablet  07/18/20  Yes [provider]  glipiZIDE (GLUCOTROL XL) 5 MG 24 hr tablet 2 (two) times daily.  04/13/16  Yes [provider]  Insulin Degludec (TRESIBA FLEXTOUCH [place]) Inject into the skin daily.   Yes [provider]  lisinopril-hydrochlorothiazide (PRINZIDE,ZESTORETIC) 20-25 MG tablet Take 1 tablet by mouth daily.   Yes [provider]  NOVOLOG FLEXPEN 100 UNIT/ML FlexPen  07/30/20  Yes [provider]  PROPRANOLOL HCL PO Take 10 mg by mouth 3 (three) times daily.    Yes [provider]  Vitamin D, Ergocalciferol, (DRISDOL) 1.25 MG (50000 UT) CAPS capsule Take 50,000 Units by mouth every 7 (seven) days.   Yes [provider]    Family History No family history on file.  Social History Social History   Tobacco Use  . Smoking status: Former Games developer  . Smokeless tobacco: Never Used  . Tobacco comment: as teenager  Vaping Use  . Vaping Use: Never used  Substance Use Topics  . Alcohol use: Yes    Comment: Holidays     Allergies   Patient has no known allergies.   Review of Systems Review of Systems  Constitutional: Negative.  Negative for activity change, appetite change, chills, diaphoresis,  fatigue and fever.  HENT: Negative.  Negative for congestion, ear pain, postnasal drip, sinus pressure, sinus pain and sore throat.   Eyes: Negative.  Negative for pain.  Respiratory: Negative.  Negative for cough, chest tightness, shortness of breath, wheezing and stridor.   Cardiovascular: Negative.  Negative for chest pain, palpitations and leg swelling.  Gastrointestinal: Positive for abdominal pain and constipation. Negative for abdominal distention, blood in stool, diarrhea, nausea and vomiting.  Genitourinary: Negative.  Negative for dysuria, flank pain, frequency, hematuria, penile discharge, penile pain, testicular pain and urgency.  Musculoskeletal: Negative.  Negative for  arthralgias, back pain, myalgias, neck pain and neck stiffness.  Skin: Negative.  Negative for color change, pallor, rash and wound.  Neurological: Negative.  Negative for dizziness, seizures, syncope, weakness, light-headedness and headaches.  All other systems reviewed and are negative.    Physical Exam Triage Vital Signs ED Triage Vitals  Enc Vitals Group     BP 09/20/20 0951 (!) 153/95     Pulse Rate 09/20/20 0951 74     Resp 09/20/20 0951 18     Temp 09/20/20 0951 98.4 F (36.9 C)     Temp Source 09/20/20 0951 Oral     SpO2 09/20/20 0951 98 %     Weight 09/20/20 0950 195 lb (88.5 kg)     Height 09/20/20 0950 5\' 9"  (1.753 m)     Head Circumference --      Peak Flow --      Pain Score 09/20/20 0950 6     Pain Loc --      Pain Edu? --      Excl. in GC? --    No data found.  Updated Vital Signs BP (!) 153/95 (BP Location: Left Arm)   Pulse 74   Temp 98.4 F (36.9 C) (Oral)   Resp 18   Ht 5\' 9"  (1.753 m)   Wt 88.5 kg   SpO2 98%   BMI 28.80 kg/m   Visual Acuity Right Eye Distance:   Left Eye Distance:   Bilateral Distance:    Right Eye Near:   Left Eye Near:    Bilateral Near:     Physical Exam Vitals and nursing note reviewed.  Constitutional:      General: He is not in acute distress.    Appearance: He is well-developed. He is not ill-appearing, toxic-appearing or diaphoretic.  HENT:     Head: Normocephalic and atraumatic.     Mouth/Throat:     Mouth: Mucous membranes are moist.     Pharynx: No pharyngeal swelling or oropharyngeal exudate.  Eyes:     General: No scleral icterus.    Extraocular Movements: Extraocular movements intact.     Pupils: Pupils are equal, round, and reactive to light.  Cardiovascular:     Rate and Rhythm: Normal rate. Rhythm irregular.     Heart sounds: Murmur heard.   Systolic murmur is present with a grade of 1/6. No friction rub. No gallop.      Comments: 1 out of 6 systolic ejection murmur.  It is consistent with a  flow murmur. Pulmonary:     Effort: Pulmonary effort is normal. No respiratory distress.     Breath sounds: Normal breath sounds. No stridor. No wheezing, rhonchi or rales.  Abdominal:     General: Abdomen is flat. Bowel sounds are normal. There is no distension. There are no signs of injury.     Palpations: Abdomen is soft. There is no shifting  dullness, fluid wave, hepatomegaly, splenomegaly or mass.     Tenderness: There is abdominal tenderness in the left lower quadrant. There is no right CVA tenderness, left CVA tenderness, guarding or rebound. Negative signs include Murphy's sign, Rovsing's sign, McBurney's sign, psoas sign and obturator sign.     Hernia: No hernia is present.     Comments: Very minimal left lower quadrant discomfort.  Not really pain.  No rebound or guarding.  No acute abdomen.  Skin:    General: Skin is warm and dry.     Capillary Refill: Capillary refill takes less than 2 seconds.     Coloration: Skin is not cyanotic, jaundiced, mottled or pale.     Findings: No erythema or rash.  Neurological:     General: No focal deficit present.     Mental Status: He is alert and oriented to person, place, and time.      UC Treatments / Results  Labs (all labs ordered are listed, but only abnormal results are displayed) Labs Reviewed - No data to display  EKG   Radiology No results found.  Procedures Procedures (including critical care time)  Medications Ordered in UC Medications - No data to display  Initial Impression / Assessment and Plan / UC Course  I have reviewed the triage vital signs and the nursing notes.  Pertinent labs & imaging results that were available during my care of the patient were reviewed by me and considered in my medical decision making (see chart for details).   Clinical impression: Lower abdominal discomfort for several days.  Vital signs and physical exam are reassuring.  Patient did take a laxative and did have a bowel movement  this morning.  He reports his symptoms are much improved.  Seems consistent with constipation.  No acute abdomen.  Treatment plan: 1.  The findings and treatment plan were discussed in detail with the patient.  Patient was in agreement. 2.  I reassured him that there was no further work-up indicated at the present time.  He did not need a CT scan or a referral for a higher level of care including the emergency room. 3.  Educational handouts provided. 4.  I have asked him just to watch for any recurrence of his pain and if it happened to call his primary care provider or go to the ER. 5.  He should probably keep up on the laxative treatment and hold for loose stools but just to make sure that he does not get significantly constipated with several days of no BMs.  He was in agreement with this. 6.  Just supportive care for now and monitor symptoms. 7.  We will discharge from from care at this time.  He was stable on discharge.  Follow-up as needed.  Greater than 30 minutes was spent with the patient obtaining a history, obtaining a review of systems, performing a thorough physical exam, reviewing his chart in detail, coming up with a treatment plan, discussing everything with the patient, encouraging and answering all questions and reassuring the patient to prevent him from going to the emergency room.  Patient was concerned with the symptoms as he could not get into his primary care physician today.  It was a pleasure seeing him in the office.    Final Clinical Impressions(s) / UC Diagnoses   Final diagnoses:  Left lower quadrant abdominal pain  Constipation, unspecified constipation type     Discharge Instructions     Please see educational handouts.  Use the laxative until you have loose stools.  Follow up with PCP if symptoms persist.  Go to ER if they get worse.    ED Prescriptions    None     PDMP not reviewed this encounter.   Delton See, MD 09/23/20 1726

## 2020-09-26 DIAGNOSIS — R1032 Left lower quadrant pain: Secondary | ICD-10-CM | POA: Diagnosis not present

## 2020-09-26 DIAGNOSIS — R109 Unspecified abdominal pain: Secondary | ICD-10-CM | POA: Diagnosis not present

## 2020-10-06 DIAGNOSIS — E1129 Type 2 diabetes mellitus with other diabetic kidney complication: Secondary | ICD-10-CM | POA: Diagnosis not present

## 2020-11-06 DIAGNOSIS — E1129 Type 2 diabetes mellitus with other diabetic kidney complication: Secondary | ICD-10-CM | POA: Diagnosis not present

## 2020-11-17 DIAGNOSIS — E1159 Type 2 diabetes mellitus with other circulatory complications: Secondary | ICD-10-CM | POA: Diagnosis not present

## 2020-11-17 DIAGNOSIS — E785 Hyperlipidemia, unspecified: Secondary | ICD-10-CM | POA: Diagnosis not present

## 2020-11-17 DIAGNOSIS — I152 Hypertension secondary to endocrine disorders: Secondary | ICD-10-CM | POA: Diagnosis not present

## 2020-11-17 DIAGNOSIS — E1129 Type 2 diabetes mellitus with other diabetic kidney complication: Secondary | ICD-10-CM | POA: Diagnosis not present

## 2020-11-17 DIAGNOSIS — E559 Vitamin D deficiency, unspecified: Secondary | ICD-10-CM | POA: Diagnosis not present

## 2020-11-17 DIAGNOSIS — Z794 Long term (current) use of insulin: Secondary | ICD-10-CM | POA: Diagnosis not present

## 2020-11-17 DIAGNOSIS — R809 Proteinuria, unspecified: Secondary | ICD-10-CM | POA: Diagnosis not present

## 2020-11-17 DIAGNOSIS — E1169 Type 2 diabetes mellitus with other specified complication: Secondary | ICD-10-CM | POA: Diagnosis not present

## 2020-12-06 DIAGNOSIS — E1129 Type 2 diabetes mellitus with other diabetic kidney complication: Secondary | ICD-10-CM | POA: Diagnosis not present

## 2020-12-26 ENCOUNTER — Ambulatory Visit
Admission: EM | Admit: 2020-12-26 | Discharge: 2020-12-26 | Disposition: A | Discharge status: Home / Self Care | Payer: Medicare HMO | Attending: Family Medicine | Admitting: Family Medicine

## 2020-12-26 ENCOUNTER — Other Ambulatory Visit: Payer: Self-pay

## 2020-12-26 DIAGNOSIS — J0110 Acute frontal sinusitis, unspecified: Secondary | ICD-10-CM | POA: Diagnosis not present

## 2020-12-26 MED ORDER — AMOXICILLIN-POT CLAVULANATE 875-125 MG PO TABS: 1.0000 | ORAL_TABLET | Freq: Two times a day (BID) | ORAL | 0 refills | Status: DC

## 2020-12-26 MED ORDER — IPRATROPIUM BROMIDE 0.06 % NA SOLN: 2.0000 | Freq: Four times a day (QID) | NASAL | 0 refills | Status: AC | PRN

## 2020-12-26 NOTE — Discharge Instructions (Addendum)
Continue the Zyretc.  Medications as prescribed.  Take care  Dr. Adriana Simas

## 2020-12-26 NOTE — ED Provider Notes (Signed)
[facility]    CSN: [id number] Arrival date & time: 12/26/20  0831      History   Chief Complaint Chief Complaint  Patient presents with   Nasal Congestion   Headache    HPI 80 year old male presents with the above complaints.  Patient reports that his symptoms started recently after work.  He reports sinus pain and pressure as well as congestion.  He reports that his congestion is on the right side.  He feels very poorly.  He rates his pain as 9/10 in severity.  He has used hot compresses as well as Zyrtec without resolution.  No documented fever.  No other associated symptoms.  No other complaints.  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   Hypertension     Patient Active Problem List   Diagnosis Date Noted   Special screening for malignant neoplasms, colon     Past Surgical History:  Procedure Laterality Date   CHOLECYSTECTOMY     COLONOSCOPY     COLONOSCOPY WITH PROPOFOL N/A 06/01/2016   Procedure: COLONOSCOPY WITH PROPOFOL;  Surgeon: Midge Minium, MD;  Location: [facility];  Service: Endoscopy;  Laterality: N/A;  Diabetic    Home Medications    Prior to Admission medications   Medication Sig Start Date End Date Taking? Authorizing Provider  amoxicillin-clavulanate (AUGMENTIN) 875-125 MG tablet Take 1 tablet by mouth 2 (two) times daily. 12/26/20  Yes Drayden Lukas G, DO  atorvastatin (LIPITOR) 10 MG tablet  07/18/20  Yes [provider]  glipiZIDE (GLUCOTROL XL) 5 MG 24 hr tablet 2 (two) times daily.  04/13/16  Yes [provider]  Insulin Degludec (TRESIBA FLEXTOUCH [place]) Inject into the skin daily.   Yes [provider]  ipratropium (ATROVENT) 0.06 % nasal spray Place 2 sprays into both nostrils 4 (four) times daily as needed for rhinitis. 12/26/20  Yes Domino Holten G, DO  lisinopril-hydrochlorothiazide (PRINZIDE,ZESTORETIC)  20-25 MG tablet Take 1 tablet by mouth daily.   Yes [provider]  NOVOLOG FLEXPEN 100 UNIT/ML FlexPen  07/30/20  Yes [provider]  PROPRANOLOL HCL PO Take 10 mg by mouth 3 (three) times daily.    Yes [provider]  Vitamin D, Ergocalciferol, (DRISDOL) 1.25 MG (50000 UT) CAPS capsule Take 50,000 Units by mouth every 7 (seven) days.   Yes [provider]   Social History Social History   Tobacco Use   Smoking status: Former   Smokeless tobacco: Never   Tobacco comments:    as teenager  Vaping Use   Vaping Use: Never used  Substance Use Topics   Alcohol use: Yes    Comment: Holidays     Allergies   Patient has no known allergies.   Review of Systems Review of Systems Per HPI  Physical Exam Triage Vital Signs ED Triage Vitals  Enc Vitals Group     BP 12/26/20 0900 (!) 151/91     Pulse Rate 12/26/20 0900 85     Resp 12/26/20 0900 18     Temp 12/26/20 0900 98.8 F (37.1 C)     Temp Source 12/26/20 0900 Oral     SpO2 12/26/20 0900 99 %     Weight 12/26/20 0858 195 lb (88.5 kg)     Height 12/26/20 0858 5\' 9"  (1.753 m)     Head Circumference --      Peak  Flow --      Pain Score 12/26/20 0858 9     Pain Loc --      Pain Edu? --      Excl. in GC? --    Updated Vital Signs BP (!) 151/91 (BP Location: Left Arm)   Pulse 85   Temp 98.8 F (37.1 C) (Oral)   Resp 18   Ht 5\' 9"  (1.753 m)   Wt 88.5 kg   SpO2 99%   BMI 28.80 kg/m   Visual Acuity Right Eye Distance:   Left Eye Distance:   Bilateral Distance:    Right Eye Near:   Left Eye Near:    Bilateral Near:     Physical Exam Vitals and nursing note reviewed.  Constitutional:      General: He is not in acute distress.    Appearance: Normal appearance. He is not ill-appearing.  HENT:     Head: Normocephalic and atraumatic.     Right Ear: Tympanic membrane normal.     Left Ear: Tympanic membrane normal.     Nose: Congestion present.     Mouth/Throat:     Pharynx:  Oropharynx is clear.  Eyes:     General:        Right eye: No discharge.        Left eye: No discharge.     Conjunctiva/sclera: Conjunctivae normal.  Cardiovascular:     Rate and Rhythm: Normal rate and regular rhythm.  Pulmonary:     Effort: Pulmonary effort is normal.     Breath sounds: Normal breath sounds. No wheezing, rhonchi or rales.  Neurological:     Mental Status: He is alert.     UC Treatments / Results  Labs (all labs ordered are listed, but only abnormal results are displayed) Labs Reviewed - No data to display  EKG   Radiology No results found.  Procedures Procedures (including critical care time)  Medications Ordered in UC Medications - No data to display  Initial Impression / Assessment and Plan / UC Course  I have reviewed the triage vital signs and the nursing notes.  Pertinent labs & imaging results that were available during my care of the patient were reviewed by me and considered in my medical decision making (see chart for details).    80 year old male presents with sinusitis.  Treating with Augmentin and Atrovent nasal spray.  Final Clinical Impressions(s) / UC Diagnoses   Final diagnoses:  Acute frontal sinusitis, recurrence not specified     Discharge Instructions      Continue the Zyretc.  Medications as prescribed.  Take care  Dr. 94    ED Prescriptions     Medication Sig Dispense Auth. Provider   ipratropium (ATROVENT) 0.06 % nasal spray Place 2 sprays into both nostrils 4 (four) times daily as needed for rhinitis. 15 mL Ronell Duffus G, DO   amoxicillin-clavulanate (AUGMENTIN) 875-125 MG tablet Take 1 tablet by mouth 2 (two) times daily. 14 tablet [birth year]1983, DO      PDMP not reviewed this encounter.   Tommie Sams, Tommie Sams 12/26/20 [phone]

## 2020-12-26 NOTE — ED Triage Notes (Signed)
Pt c/o nasal/sinus congestion and facial/head pressure for several days. Pt denies f/n/v/d, otalgia or other symptoms. Pt does report occasional cough. Pt has been applying hot towels to his face. Pt does report having dental work done this past Friday, symptoms started after procedure.

## 2021-01-06 DIAGNOSIS — E1129 Type 2 diabetes mellitus with other diabetic kidney complication: Secondary | ICD-10-CM | POA: Diagnosis not present

## 2021-01-20 DIAGNOSIS — E119 Type 2 diabetes mellitus without complications: Secondary | ICD-10-CM | POA: Diagnosis not present

## 2021-01-20 DIAGNOSIS — Z794 Long term (current) use of insulin: Secondary | ICD-10-CM | POA: Diagnosis not present

## 2021-01-20 DIAGNOSIS — E785 Hyperlipidemia, unspecified: Secondary | ICD-10-CM | POA: Diagnosis not present

## 2021-01-20 DIAGNOSIS — I10 Essential (primary) hypertension: Secondary | ICD-10-CM | POA: Diagnosis not present

## 2021-02-06 DIAGNOSIS — E1129 Type 2 diabetes mellitus with other diabetic kidney complication: Secondary | ICD-10-CM | POA: Diagnosis not present

## 2021-02-07 ENCOUNTER — Other Ambulatory Visit: Payer: Self-pay

## 2021-02-07 ENCOUNTER — Ambulatory Visit
Admission: EM | Admit: 2021-02-07 | Discharge: 2021-02-07 | Disposition: A | Discharge status: Home / Self Care | Payer: Medicare HMO | Attending: Emergency Medicine | Admitting: Emergency Medicine

## 2021-02-07 ENCOUNTER — Encounter: Payer: Self-pay | Admitting: Emergency Medicine

## 2021-02-07 DIAGNOSIS — M545 Low back pain, unspecified: Secondary | ICD-10-CM | POA: Diagnosis not present

## 2021-02-07 MED ORDER — METHYLPREDNISOLONE 4 MG PO TBPK: ORAL_TABLET | ORAL | 0 refills | Status: DC

## 2021-02-07 MED ORDER — BACLOFEN 5 MG PO TABS: 5.0000 mg | ORAL_TABLET | Freq: Two times a day (BID) | ORAL | 0 refills | Status: DC

## 2021-02-07 NOTE — Discharge Instructions (Addendum)
Start the Medrol Dosepak this morning and take it according to package instructions.  Atypical dosing pattern starts with 2 tablets of breakfast and 1 tablet at lunch.  Given the later hour of the day I would take the morning and lunchtime doses at the same time to get you back on track.  Take the baclofen twice daily to help alleviate muscle spasm.  Apply moist heat to your low back for 20 minutes at a time 2-3 times a day to improve blood flow and relax the muscles that are in spasm.  You can continue to wear the back brace intermittently if it provides you pain relief.  Follow the back exercises given your discharge paperwork as this has stretches in it which can also help alleviate your muscle spasm.  Follow-up with your primary care provider for any continued or worsening symptoms.

## 2021-02-07 NOTE — ED Triage Notes (Signed)
C/o right lower back pain x 4 days, denies injury, denies dysuria

## 2021-02-07 NOTE — ED Provider Notes (Signed)
[facility]    CSN: [id number] Arrival date & time: 02/07/21  0840      History   Chief Complaint Chief Complaint  Patient presents with   Back Pain    HPI Jordan Jennings is a 80 y.o. male.   HPI  80 year old male here for evaluation of back pain.  Patient reports that he developed pain in the right lower back 4 days ago.  This pain is nonradiating and is not the result of an injury or fall.  Patient states that he does take naps in his recliner and sometimes he sleeps at an odd angle which has caused back pain in the past.  Aggravating factors for the pain are laying down as patient reports that he is unable to find any comfortable position and this has been impacting his sleep.  During the day when he is upright the pain improves and is not noticeable unless he moves certain ways.  He has taken Tylenol without any relief of symptoms.  He is also used a back brace which did improve his symptoms.  He denies any heavy lifting or urinary complaints.  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   Hypertension     Patient Active Problem List   Diagnosis Date Noted   Special screening for malignant neoplasms, colon     Past Surgical History:  Procedure Laterality Date   CHOLECYSTECTOMY     COLONOSCOPY     COLONOSCOPY WITH PROPOFOL N/A 06/01/2016   Procedure: COLONOSCOPY WITH PROPOFOL;  Surgeon: Midge Minium, MD;  Location: [facility];  Service: Endoscopy;  Laterality: N/A;  Diabetic       Home Medications    Prior to Admission medications   Medication Sig Start Date End Date Taking? Authorizing Provider  Baclofen 5 MG TABS Take 5 mg by mouth 2 (two) times daily. 02/07/21  Yes Becky Augusta, NP  methylPREDNISolone (MEDROL DOSEPAK) 4 MG TBPK tablet Take according to  the package insert. 02/07/21  Yes Becky Augusta, NP  atorvastatin (LIPITOR) 10 MG tablet  07/18/20   [provider]  glipiZIDE (GLUCOTROL XL) 5 MG 24 hr tablet 2 (two) times daily.  04/13/16   [provider]  Insulin Degludec (TRESIBA FLEXTOUCH [place]) Inject into the skin daily.    [provider]  ipratropium (ATROVENT) 0.06 % nasal spray Place 2 sprays into both nostrils 4 (four) times daily as needed for rhinitis. 12/26/20   Tommie Sams, DO  lisinopril-hydrochlorothiazide (PRINZIDE,ZESTORETIC) 20-25 MG tablet Take 1 tablet by mouth daily.    [provider]  NOVOLOG FLEXPEN 100 UNIT/ML FlexPen  07/30/20   [provider]  PROPRANOLOL HCL PO Take 10 mg by mouth 3 (three) times daily.     [provider]  Vitamin D, Ergocalciferol, (DRISDOL) 1.25 MG (50000 UT) CAPS capsule Take 50,000 Units by mouth every 7 (seven) days.    [provider]    Family History History reviewed. No pertinent family history.  Social History Social History   Tobacco Use   Smoking status: Former   Smokeless tobacco: Never   Tobacco comments:    as teenager  Advertising account planner   Vaping Use: Never used  Substance Use Topics   Alcohol use: Yes    Comment: Holidays  Drug use: Never     Allergies   Patient has no known allergies.   Review of Systems Review of Systems  Constitutional:  Negative for activity change, appetite change and fever.  Genitourinary:  Negative for dysuria, frequency and urgency.  Musculoskeletal:  Positive for back pain.  Neurological:  Negative for weakness and numbness.  Hematological: Negative.   Psychiatric/Behavioral: Negative.      Physical Exam Triage Vital Signs ED Triage Vitals  Enc Vitals Group     BP 02/07/21 1000 (!) 185/95     Pulse Rate 02/07/21 1000 (!) 55     Resp 02/07/21 1000 18     Temp 02/07/21 1000 98 F (36.7 C)     Temp Source 02/07/21 1000 Oral     SpO2 02/07/21 1000 100 %     Weight --       Height --      Head Circumference --      Peak Flow --      Pain Score 02/07/21 0958 5     Pain Loc --      Pain Edu? --      Excl. in GC? --    No data found.  Updated Vital Signs BP (!) 185/95 (BP Location: Left Arm)   Pulse (!) 55   Temp 98 F (36.7 C) (Oral)   Resp 18   SpO2 100%   Visual Acuity Right Eye Distance:   Left Eye Distance:   Bilateral Distance:    Right Eye Near:   Left Eye Near:    Bilateral Near:     Physical Exam Vitals and nursing note reviewed.  Constitutional:      General: He is not in acute distress.    Appearance: Normal appearance. He is not ill-appearing.  HENT:     Head: Normocephalic and atraumatic.  Cardiovascular:     Rate and Rhythm: Normal rate and regular rhythm.     Pulses: Normal pulses.     Heart sounds: Normal heart sounds. No murmur heard.   No gallop.  Pulmonary:     Effort: Pulmonary effort is normal.     Breath sounds: Normal breath sounds. No wheezing, rhonchi or rales.  Abdominal:     Tenderness: There is no right CVA tenderness or left CVA tenderness.  Musculoskeletal:        General: Tenderness present. No swelling, deformity or signs of injury.  Skin:    General: Skin is warm and dry.     Capillary Refill: Capillary refill takes less than 2 seconds.     Findings: No erythema or rash.  Neurological:     General: No focal deficit present.     Mental Status: He is alert and oriented to person, place, and time.  Psychiatric:        Mood and Affect: Mood normal.        Behavior: Behavior normal.        Thought Content: Thought content normal.        Judgment: Judgment normal.     UC Treatments / Results  Labs (all labs ordered are listed, but only abnormal results are displayed) Labs Reviewed - No data to display  EKG   Radiology No results found.  Procedures Procedures (including critical care time)  Medications Ordered in UC Medications - No data to display  Initial Impression / Assessment and  Plan / UC Course  I have reviewed the triage vital signs and the nursing notes.  Pertinent  labs & imaging results that were available during my care of the patient were reviewed by me and considered in my medical decision making (see chart for details).  Patient is a nontoxic-appearing 80 year old male here for evaluation of right-sided low back pain that is nonradiating and has been present for the past 4 days.  This pain increases at night when he lays down and is in turn.  With his ability to sleep.  This is not associate with any injury or heavy lifting.  Patient's physical exam reveals a benign cardiopulmonary exam with clear lung sounds in all fields.  Patient has no CVA tenderness on exam.  Patient has no midline spinous process tenderness on exam.  There is moderate muscle tension and spasm in the right lower paraspinous region.  There is no reciprocal tenderness or spasm appreciated in the left lumbar paraspinous region.  Patient exam is consistent with musculoskeletal low back pain.  Given patient's CKD I am hesitant to use NSAIDs.  Patient reports that his preprandial sugars this morning were 98 and that are generally well controlled.  For that reason I have elected to use a Medrol Dosepak to help with the inflammation and will start patient on baclofen 5 mg 2 times daily as patient's calculated creatinine clearance is 66 mL/min based on CMP from 01/20/2021.   Final Clinical Impressions(s) / UC Diagnoses   Final diagnoses:  Acute right-sided low back pain without sciatica     Discharge Instructions      Start the Medrol Dosepak this morning and take it according to package instructions.  Atypical dosing pattern starts with 2 tablets of breakfast and 1 tablet at lunch.  Given the later hour of the day I would take the morning and lunchtime doses at the same time to get you back on track.  Take the baclofen twice daily to help alleviate muscle spasm.  Apply moist heat to your low back  for 20 minutes at a time 2-3 times a day to improve blood flow and relax the muscles that are in spasm.  You can continue to wear the back brace intermittently if it provides you pain relief.  Follow the back exercises given your discharge paperwork as this has stretches in it which can also help alleviate your muscle spasm.  Follow-up with your primary care provider for any continued or worsening symptoms.     ED Prescriptions     Medication Sig Dispense Auth. Provider   methylPREDNISolone (MEDROL DOSEPAK) 4 MG TBPK tablet Take according to the package insert. 1 each Becky Augusta, NP   Baclofen 5 MG TABS Take 5 mg by mouth 2 (two) times daily. 90 tablet Becky Augusta, NP      PDMP not reviewed this encounter.   Becky Augusta, NP 02/07/21 1026

## 2021-02-15 DIAGNOSIS — E118 Type 2 diabetes mellitus with unspecified complications: Secondary | ICD-10-CM | POA: Diagnosis not present

## 2021-02-15 DIAGNOSIS — M545 Low back pain, unspecified: Secondary | ICD-10-CM | POA: Diagnosis not present

## 2021-02-27 DIAGNOSIS — L603 Nail dystrophy: Secondary | ICD-10-CM | POA: Diagnosis not present

## 2021-02-27 DIAGNOSIS — L600 Ingrowing nail: Secondary | ICD-10-CM | POA: Diagnosis not present

## 2021-02-27 DIAGNOSIS — E1142 Type 2 diabetes mellitus with diabetic polyneuropathy: Secondary | ICD-10-CM | POA: Diagnosis not present

## 2021-03-08 DIAGNOSIS — E1129 Type 2 diabetes mellitus with other diabetic kidney complication: Secondary | ICD-10-CM | POA: Diagnosis not present

## 2021-03-29 DIAGNOSIS — E109 Type 1 diabetes mellitus without complications: Secondary | ICD-10-CM | POA: Diagnosis not present

## 2021-03-29 DIAGNOSIS — H52229 Regular astigmatism, unspecified eye: Secondary | ICD-10-CM | POA: Diagnosis not present

## 2021-03-29 DIAGNOSIS — Z0100 Encounter for examination of eyes and vision without abnormal findings: Secondary | ICD-10-CM | POA: Diagnosis not present

## 2021-03-29 DIAGNOSIS — I10 Essential (primary) hypertension: Secondary | ICD-10-CM | POA: Diagnosis not present

## 2021-03-29 DIAGNOSIS — E7800 Pure hypercholesterolemia, unspecified: Secondary | ICD-10-CM | POA: Diagnosis not present

## 2021-04-08 DIAGNOSIS — E1129 Type 2 diabetes mellitus with other diabetic kidney complication: Secondary | ICD-10-CM | POA: Diagnosis not present

## 2021-04-10 DIAGNOSIS — M13851 Other specified arthritis, right hip: Secondary | ICD-10-CM | POA: Diagnosis not present

## 2021-04-10 DIAGNOSIS — M25551 Pain in right hip: Secondary | ICD-10-CM | POA: Diagnosis not present

## 2021-05-08 DIAGNOSIS — E1129 Type 2 diabetes mellitus with other diabetic kidney complication: Secondary | ICD-10-CM | POA: Diagnosis not present

## 2021-05-08 DIAGNOSIS — M25551 Pain in right hip: Secondary | ICD-10-CM | POA: Diagnosis not present

## 2021-05-08 DIAGNOSIS — M13851 Other specified arthritis, right hip: Secondary | ICD-10-CM | POA: Diagnosis not present

## 2021-05-20 DIAGNOSIS — E1142 Type 2 diabetes mellitus with diabetic polyneuropathy: Secondary | ICD-10-CM | POA: Diagnosis not present

## 2021-05-31 DIAGNOSIS — M13851 Other specified arthritis, right hip: Secondary | ICD-10-CM | POA: Diagnosis not present

## 2021-06-08 DIAGNOSIS — E1129 Type 2 diabetes mellitus with other diabetic kidney complication: Secondary | ICD-10-CM | POA: Diagnosis not present

## 2021-06-22 DIAGNOSIS — I152 Hypertension secondary to endocrine disorders: Secondary | ICD-10-CM | POA: Diagnosis not present

## 2021-06-22 DIAGNOSIS — R809 Proteinuria, unspecified: Secondary | ICD-10-CM | POA: Diagnosis not present

## 2021-06-22 DIAGNOSIS — E1169 Type 2 diabetes mellitus with other specified complication: Secondary | ICD-10-CM | POA: Diagnosis not present

## 2021-06-22 DIAGNOSIS — E785 Hyperlipidemia, unspecified: Secondary | ICD-10-CM | POA: Diagnosis not present

## 2021-06-22 DIAGNOSIS — E1129 Type 2 diabetes mellitus with other diabetic kidney complication: Secondary | ICD-10-CM | POA: Diagnosis not present

## 2021-06-22 DIAGNOSIS — I10 Essential (primary) hypertension: Secondary | ICD-10-CM | POA: Diagnosis not present

## 2021-06-22 DIAGNOSIS — Z794 Long term (current) use of insulin: Secondary | ICD-10-CM | POA: Diagnosis not present

## 2021-06-22 DIAGNOSIS — E1159 Type 2 diabetes mellitus with other circulatory complications: Secondary | ICD-10-CM | POA: Diagnosis not present

## 2021-06-23 DIAGNOSIS — Z794 Long term (current) use of insulin: Secondary | ICD-10-CM | POA: Diagnosis not present

## 2021-06-23 DIAGNOSIS — E785 Hyperlipidemia, unspecified: Secondary | ICD-10-CM | POA: Diagnosis not present

## 2021-06-23 DIAGNOSIS — I152 Hypertension secondary to endocrine disorders: Secondary | ICD-10-CM | POA: Diagnosis not present

## 2021-06-23 DIAGNOSIS — R809 Proteinuria, unspecified: Secondary | ICD-10-CM | POA: Diagnosis not present

## 2021-06-23 DIAGNOSIS — E1129 Type 2 diabetes mellitus with other diabetic kidney complication: Secondary | ICD-10-CM | POA: Diagnosis not present

## 2021-06-23 DIAGNOSIS — E1159 Type 2 diabetes mellitus with other circulatory complications: Secondary | ICD-10-CM | POA: Diagnosis not present

## 2021-06-23 DIAGNOSIS — E1169 Type 2 diabetes mellitus with other specified complication: Secondary | ICD-10-CM | POA: Diagnosis not present

## 2021-07-03 DIAGNOSIS — E1142 Type 2 diabetes mellitus with diabetic polyneuropathy: Secondary | ICD-10-CM | POA: Diagnosis not present

## 2021-07-09 DIAGNOSIS — E1129 Type 2 diabetes mellitus with other diabetic kidney complication: Secondary | ICD-10-CM | POA: Diagnosis not present

## 2021-08-06 DIAGNOSIS — E1129 Type 2 diabetes mellitus with other diabetic kidney complication: Secondary | ICD-10-CM | POA: Diagnosis not present

## 2021-08-09 DIAGNOSIS — E1129 Type 2 diabetes mellitus with other diabetic kidney complication: Secondary | ICD-10-CM | POA: Diagnosis not present

## 2021-12-06 DIAGNOSIS — E1129 Type 2 diabetes mellitus with other diabetic kidney complication: Secondary | ICD-10-CM | POA: Diagnosis not present

## 2021-12-07 DIAGNOSIS — E119 Type 2 diabetes mellitus without complications: Secondary | ICD-10-CM | POA: Diagnosis not present

## 2021-12-13 DIAGNOSIS — E1142 Type 2 diabetes mellitus with diabetic polyneuropathy: Secondary | ICD-10-CM | POA: Diagnosis not present

## 2022-01-01 DIAGNOSIS — L603 Nail dystrophy: Secondary | ICD-10-CM | POA: Diagnosis not present

## 2022-01-01 DIAGNOSIS — E1142 Type 2 diabetes mellitus with diabetic polyneuropathy: Secondary | ICD-10-CM | POA: Diagnosis not present

## 2022-01-06 DIAGNOSIS — E1129 Type 2 diabetes mellitus with other diabetic kidney complication: Secondary | ICD-10-CM | POA: Diagnosis not present

## 2022-01-16 DIAGNOSIS — Z794 Long term (current) use of insulin: Secondary | ICD-10-CM | POA: Diagnosis not present

## 2022-01-16 DIAGNOSIS — E119 Type 2 diabetes mellitus without complications: Secondary | ICD-10-CM | POA: Diagnosis not present

## 2022-01-16 DIAGNOSIS — I10 Essential (primary) hypertension: Secondary | ICD-10-CM | POA: Diagnosis not present

## 2022-01-16 DIAGNOSIS — R809 Proteinuria, unspecified: Secondary | ICD-10-CM | POA: Diagnosis not present

## 2022-01-16 DIAGNOSIS — E785 Hyperlipidemia, unspecified: Secondary | ICD-10-CM | POA: Diagnosis not present

## 2022-01-31 DIAGNOSIS — E559 Vitamin D deficiency, unspecified: Secondary | ICD-10-CM | POA: Diagnosis not present

## 2022-01-31 DIAGNOSIS — N1831 Chronic kidney disease, stage 3a: Secondary | ICD-10-CM | POA: Diagnosis not present

## 2022-01-31 DIAGNOSIS — E1159 Type 2 diabetes mellitus with other circulatory complications: Secondary | ICD-10-CM | POA: Diagnosis not present

## 2022-01-31 DIAGNOSIS — R809 Proteinuria, unspecified: Secondary | ICD-10-CM | POA: Diagnosis not present

## 2022-01-31 DIAGNOSIS — E1169 Type 2 diabetes mellitus with other specified complication: Secondary | ICD-10-CM | POA: Diagnosis not present

## 2022-01-31 DIAGNOSIS — E785 Hyperlipidemia, unspecified: Secondary | ICD-10-CM | POA: Diagnosis not present

## 2022-01-31 DIAGNOSIS — E1129 Type 2 diabetes mellitus with other diabetic kidney complication: Secondary | ICD-10-CM | POA: Diagnosis not present

## 2022-01-31 DIAGNOSIS — E1122 Type 2 diabetes mellitus with diabetic chronic kidney disease: Secondary | ICD-10-CM | POA: Diagnosis not present

## 2022-01-31 DIAGNOSIS — Z794 Long term (current) use of insulin: Secondary | ICD-10-CM | POA: Diagnosis not present

## 2022-01-31 DIAGNOSIS — I152 Hypertension secondary to endocrine disorders: Secondary | ICD-10-CM | POA: Diagnosis not present

## 2022-02-02 DIAGNOSIS — M5416 Radiculopathy, lumbar region: Secondary | ICD-10-CM | POA: Diagnosis not present

## 2022-02-02 DIAGNOSIS — I10 Essential (primary) hypertension: Secondary | ICD-10-CM | POA: Diagnosis not present

## 2022-02-06 DIAGNOSIS — E1129 Type 2 diabetes mellitus with other diabetic kidney complication: Secondary | ICD-10-CM | POA: Diagnosis not present

## 2022-03-14 DIAGNOSIS — M25751 Osteophyte, right hip: Secondary | ICD-10-CM | POA: Diagnosis not present

## 2022-03-14 DIAGNOSIS — E119 Type 2 diabetes mellitus without complications: Secondary | ICD-10-CM | POA: Diagnosis not present

## 2022-03-14 DIAGNOSIS — Z87891 Personal history of nicotine dependence: Secondary | ICD-10-CM | POA: Diagnosis not present

## 2022-03-14 DIAGNOSIS — R1030 Lower abdominal pain, unspecified: Secondary | ICD-10-CM | POA: Diagnosis not present

## 2022-03-14 DIAGNOSIS — I10 Essential (primary) hypertension: Secondary | ICD-10-CM | POA: Diagnosis not present

## 2022-03-14 DIAGNOSIS — M1611 Unilateral primary osteoarthritis, right hip: Secondary | ICD-10-CM | POA: Diagnosis not present

## 2022-03-14 DIAGNOSIS — R1031 Right lower quadrant pain: Secondary | ICD-10-CM | POA: Diagnosis not present

## 2022-03-14 DIAGNOSIS — E785 Hyperlipidemia, unspecified: Secondary | ICD-10-CM | POA: Diagnosis not present

## 2022-03-14 DIAGNOSIS — Z794 Long term (current) use of insulin: Secondary | ICD-10-CM | POA: Diagnosis not present

## 2022-03-14 DIAGNOSIS — M545 Low back pain, unspecified: Secondary | ICD-10-CM | POA: Diagnosis not present

## 2022-03-20 DIAGNOSIS — R1031 Right lower quadrant pain: Secondary | ICD-10-CM | POA: Diagnosis not present

## 2022-04-16 DIAGNOSIS — E1129 Type 2 diabetes mellitus with other diabetic kidney complication: Secondary | ICD-10-CM | POA: Diagnosis not present

## 2022-06-08 DIAGNOSIS — R059 Cough, unspecified: Secondary | ICD-10-CM | POA: Diagnosis not present

## 2022-06-08 DIAGNOSIS — E1129 Type 2 diabetes mellitus with other diabetic kidney complication: Secondary | ICD-10-CM | POA: Diagnosis not present

## 2022-06-08 DIAGNOSIS — Z03818 Encounter for observation for suspected exposure to other biological agents ruled out: Secondary | ICD-10-CM | POA: Diagnosis not present

## 2022-06-08 DIAGNOSIS — Z87891 Personal history of nicotine dependence: Secondary | ICD-10-CM | POA: Diagnosis not present

## 2022-06-08 DIAGNOSIS — Z794 Long term (current) use of insulin: Secondary | ICD-10-CM | POA: Diagnosis not present

## 2022-06-08 DIAGNOSIS — E785 Hyperlipidemia, unspecified: Secondary | ICD-10-CM | POA: Diagnosis not present

## 2022-06-08 DIAGNOSIS — I10 Essential (primary) hypertension: Secondary | ICD-10-CM | POA: Diagnosis not present

## 2022-06-08 DIAGNOSIS — R809 Proteinuria, unspecified: Secondary | ICD-10-CM | POA: Diagnosis not present

## 2022-07-13 DIAGNOSIS — H26491 Other secondary cataract, right eye: Secondary | ICD-10-CM | POA: Diagnosis not present

## 2022-07-13 DIAGNOSIS — E1159 Type 2 diabetes mellitus with other circulatory complications: Secondary | ICD-10-CM | POA: Diagnosis not present

## 2022-07-13 DIAGNOSIS — I152 Hypertension secondary to endocrine disorders: Secondary | ICD-10-CM | POA: Diagnosis not present

## 2022-07-13 DIAGNOSIS — R809 Proteinuria, unspecified: Secondary | ICD-10-CM | POA: Diagnosis not present

## 2022-07-13 DIAGNOSIS — Z794 Long term (current) use of insulin: Secondary | ICD-10-CM | POA: Diagnosis not present

## 2022-07-13 DIAGNOSIS — E785 Hyperlipidemia, unspecified: Secondary | ICD-10-CM | POA: Diagnosis not present

## 2022-07-13 DIAGNOSIS — E1169 Type 2 diabetes mellitus with other specified complication: Secondary | ICD-10-CM | POA: Diagnosis not present

## 2022-07-13 DIAGNOSIS — E1129 Type 2 diabetes mellitus with other diabetic kidney complication: Secondary | ICD-10-CM | POA: Diagnosis not present

## 2022-07-15 DIAGNOSIS — E1129 Type 2 diabetes mellitus with other diabetic kidney complication: Secondary | ICD-10-CM | POA: Diagnosis not present

## 2022-08-23 ENCOUNTER — Other Ambulatory Visit
Admission: RE | Admit: 2022-08-23 | Discharge: 2022-08-23 | Disposition: A | Discharge status: Home / Self Care | Payer: Medicare HMO | Source: Ambulatory Visit | Attending: Infectious Diseases | Admitting: Infectious Diseases

## 2022-08-23 DIAGNOSIS — Z794 Long term (current) use of insulin: Secondary | ICD-10-CM | POA: Diagnosis not present

## 2022-08-23 DIAGNOSIS — R600 Localized edema: Secondary | ICD-10-CM | POA: Insufficient documentation

## 2022-08-23 DIAGNOSIS — E1122 Type 2 diabetes mellitus with diabetic chronic kidney disease: Secondary | ICD-10-CM | POA: Diagnosis not present

## 2022-08-23 DIAGNOSIS — R011 Cardiac murmur, unspecified: Secondary | ICD-10-CM | POA: Diagnosis not present

## 2022-08-23 DIAGNOSIS — N1831 Chronic kidney disease, stage 3a: Secondary | ICD-10-CM | POA: Diagnosis not present

## 2022-08-23 DIAGNOSIS — E1159 Type 2 diabetes mellitus with other circulatory complications: Secondary | ICD-10-CM | POA: Diagnosis not present

## 2022-08-23 DIAGNOSIS — E119 Type 2 diabetes mellitus without complications: Secondary | ICD-10-CM | POA: Diagnosis not present

## 2022-08-23 DIAGNOSIS — I10 Essential (primary) hypertension: Secondary | ICD-10-CM | POA: Diagnosis not present

## 2022-08-23 DIAGNOSIS — N182 Chronic kidney disease, stage 2 (mild): Secondary | ICD-10-CM | POA: Insufficient documentation

## 2022-08-23 LAB — BRAIN NATRIURETIC PEPTIDE: B Natriuretic Peptide: 25.3 pg/mL (ref 0.0–100.0)

## 2022-09-06 DIAGNOSIS — I10 Essential (primary) hypertension: Secondary | ICD-10-CM | POA: Diagnosis not present

## 2022-09-06 DIAGNOSIS — E785 Hyperlipidemia, unspecified: Secondary | ICD-10-CM | POA: Diagnosis not present

## 2022-09-06 DIAGNOSIS — R809 Proteinuria, unspecified: Secondary | ICD-10-CM | POA: Diagnosis not present

## 2022-09-06 DIAGNOSIS — Z794 Long term (current) use of insulin: Secondary | ICD-10-CM | POA: Diagnosis not present

## 2022-09-06 DIAGNOSIS — Z87891 Personal history of nicotine dependence: Secondary | ICD-10-CM | POA: Diagnosis not present

## 2022-09-06 DIAGNOSIS — Z0000 Encounter for general adult medical examination without abnormal findings: Secondary | ICD-10-CM | POA: Diagnosis not present

## 2022-09-06 DIAGNOSIS — E1129 Type 2 diabetes mellitus with other diabetic kidney complication: Secondary | ICD-10-CM | POA: Diagnosis not present

## 2022-10-12 DIAGNOSIS — Z961 Presence of intraocular lens: Secondary | ICD-10-CM | POA: Diagnosis not present

## 2022-10-12 DIAGNOSIS — H18413 Arcus senilis, bilateral: Secondary | ICD-10-CM | POA: Diagnosis not present

## 2022-10-12 DIAGNOSIS — H26493 Other secondary cataract, bilateral: Secondary | ICD-10-CM | POA: Diagnosis not present

## 2022-10-12 DIAGNOSIS — E119 Type 2 diabetes mellitus without complications: Secondary | ICD-10-CM | POA: Diagnosis not present

## 2022-10-12 DIAGNOSIS — H26491 Other secondary cataract, right eye: Secondary | ICD-10-CM | POA: Diagnosis not present

## 2022-10-13 DIAGNOSIS — E1129 Type 2 diabetes mellitus with other diabetic kidney complication: Secondary | ICD-10-CM | POA: Diagnosis not present

## 2022-10-18 DIAGNOSIS — H26491 Other secondary cataract, right eye: Secondary | ICD-10-CM | POA: Diagnosis not present

## 2022-11-26 ENCOUNTER — Other Ambulatory Visit: Payer: Self-pay

## 2022-11-26 ENCOUNTER — Ambulatory Visit (INDEPENDENT_AMBULATORY_CARE_PROVIDER_SITE_OTHER): Payer: Medicare HMO

## 2022-11-26 ENCOUNTER — Encounter: Payer: Self-pay | Admitting: Emergency Medicine

## 2022-11-26 ENCOUNTER — Observation Stay
Admission: EM | Admit: 2022-11-26 | Payer: Medicare HMO | Source: Home / Self Care | Attending: Emergency Medicine | Admitting: Emergency Medicine

## 2022-11-26 ENCOUNTER — Emergency Department: Payer: Medicare HMO

## 2022-11-26 ENCOUNTER — Ambulatory Visit
Admission: EM | Admit: 2022-11-26 | Discharge: 2022-11-26 | Disposition: A | Discharge status: Other Acute Inpatient Hospital | Payer: Medicare HMO | Source: Home / Self Care | Attending: Family Medicine | Admitting: Family Medicine

## 2022-11-26 DIAGNOSIS — R0602 Shortness of breath: Secondary | ICD-10-CM | POA: Insufficient documentation

## 2022-11-26 DIAGNOSIS — R7989 Other specified abnormal findings of blood chemistry: Secondary | ICD-10-CM | POA: Insufficient documentation

## 2022-11-26 DIAGNOSIS — R000 Tachycardia, unspecified: Secondary | ICD-10-CM | POA: Insufficient documentation

## 2022-11-26 DIAGNOSIS — Z79899 Other long term (current) drug therapy: Secondary | ICD-10-CM | POA: Insufficient documentation

## 2022-11-26 DIAGNOSIS — E1159 Type 2 diabetes mellitus with other circulatory complications: Secondary | ICD-10-CM | POA: Diagnosis present

## 2022-11-26 DIAGNOSIS — Z794 Long term (current) use of insulin: Secondary | ICD-10-CM | POA: Insufficient documentation

## 2022-11-26 DIAGNOSIS — I2510 Atherosclerotic heart disease of native coronary artery without angina pectoris: Secondary | ICD-10-CM | POA: Insufficient documentation

## 2022-11-26 DIAGNOSIS — Z87891 Personal history of nicotine dependence: Secondary | ICD-10-CM | POA: Diagnosis not present

## 2022-11-26 DIAGNOSIS — N1831 Chronic kidney disease, stage 3a: Secondary | ICD-10-CM | POA: Diagnosis present

## 2022-11-26 DIAGNOSIS — E1122 Type 2 diabetes mellitus with diabetic chronic kidney disease: Secondary | ICD-10-CM | POA: Insufficient documentation

## 2022-11-26 DIAGNOSIS — N182 Chronic kidney disease, stage 2 (mild): Secondary | ICD-10-CM | POA: Diagnosis present

## 2022-11-26 DIAGNOSIS — J432 Centrilobular emphysema: Secondary | ICD-10-CM | POA: Diagnosis not present

## 2022-11-26 DIAGNOSIS — I129 Hypertensive chronic kidney disease with stage 1 through stage 4 chronic kidney disease, or unspecified chronic kidney disease: Secondary | ICD-10-CM | POA: Insufficient documentation

## 2022-11-26 DIAGNOSIS — Z1152 Encounter for screening for COVID-19: Secondary | ICD-10-CM | POA: Insufficient documentation

## 2022-11-26 DIAGNOSIS — Z7984 Long term (current) use of oral hypoglycemic drugs: Secondary | ICD-10-CM | POA: Insufficient documentation

## 2022-11-26 DIAGNOSIS — N179 Acute kidney failure, unspecified: Secondary | ICD-10-CM | POA: Diagnosis not present

## 2022-11-26 DIAGNOSIS — I152 Hypertension secondary to endocrine disorders: Secondary | ICD-10-CM | POA: Diagnosis present

## 2022-11-26 DIAGNOSIS — I259 Chronic ischemic heart disease, unspecified: Secondary | ICD-10-CM | POA: Diagnosis not present

## 2022-11-26 DIAGNOSIS — I700 Atherosclerosis of aorta: Secondary | ICD-10-CM | POA: Diagnosis not present

## 2022-11-26 DIAGNOSIS — J069 Acute upper respiratory infection, unspecified: Secondary | ICD-10-CM | POA: Insufficient documentation

## 2022-11-26 DIAGNOSIS — Z7982 Long term (current) use of aspirin: Secondary | ICD-10-CM | POA: Insufficient documentation

## 2022-11-26 DIAGNOSIS — R079 Chest pain, unspecified: Secondary | ICD-10-CM | POA: Diagnosis present

## 2022-11-26 DIAGNOSIS — R778 Other specified abnormalities of plasma proteins: Secondary | ICD-10-CM | POA: Diagnosis not present

## 2022-11-26 LAB — CBC
HCT: 39.8 % (ref 39.0–52.0)
Hemoglobin: 13 g/dL (ref 13.0–17.0)
MCH: 27.4 pg (ref 26.0–34.0)
MCHC: 32.7 g/dL (ref 30.0–36.0)
MCV: 84 fL (ref 80.0–100.0)
Platelets: 323 10*3/uL (ref 150–400)
RBC: 4.74 MIL/uL (ref 4.22–5.81)
RDW: 14.4 % (ref 11.5–15.5)
WBC: 8.6 10*3/uL (ref 4.0–10.5)
nRBC: 0 % (ref 0.0–0.2)

## 2022-11-26 LAB — TROPONIN I (HIGH SENSITIVITY)
Troponin I (High Sensitivity): 50 ng/L — ABNORMAL HIGH (ref <18)
Troponin I (High Sensitivity): 67 ng/L — ABNORMAL HIGH (ref <18)
Troponin I (High Sensitivity): 74 ng/L — ABNORMAL HIGH (ref <18)

## 2022-11-26 LAB — BASIC METABOLIC PANEL
Anion gap: 10 (ref 5–15)
BUN: 31 mg/dL — ABNORMAL HIGH (ref 8–23)
CO2: 25 mmol/L (ref 22–32)
Calcium: 9.5 mg/dL (ref 8.9–10.3)
Chloride: 104 mmol/L (ref 98–111)
Creatinine, Ser: 1.88 mg/dL — ABNORMAL HIGH (ref 0.61–1.24)
GFR, Estimated: 35 mL/min — ABNORMAL LOW (ref >60)
Glucose, Bld: 207 mg/dL — ABNORMAL HIGH (ref 70–99)
Potassium: 3.8 mmol/L (ref 3.5–5.1)
Sodium: 139 mmol/L (ref 135–145)

## 2022-11-26 LAB — SARS CORONAVIRUS 2 BY RT PCR: SARS Coronavirus 2 by RT PCR: NEGATIVE

## 2022-11-26 MED ORDER — ASPIRIN 325 MG PO TABS
325.0000 mg | ORAL_TABLET | Freq: Once | ORAL | Status: AC
  Administered 2022-11-26: 325 mg via ORAL
  Filled 2022-11-26: qty 1

## 2022-11-26 MED ORDER — IOHEXOL 350 MG/ML SOLN
60.0000 mL | Freq: Once | INTRAVENOUS | Status: AC | PRN
  Administered 2022-11-26: 60 mL via INTRAVENOUS

## 2022-11-26 NOTE — ED Provider Notes (Signed)
[facility]    CSN: [id number] Arrival date & time: 11/26/22  1831      History   Chief Complaint Chief Complaint  Patient presents with   Shortness of Breath    HPI Jordan Jennings is a 82 y.o. male.   HPI  History obtained from  his wife and the patient. Jordan Jennings presents for cough for the past 5 days with new onset shortness of breath around 2 PM today. Felt like the wind was knocked out of him. Has palpitations but no chest pain, fever or abdominal pain.  Cough has gotten better. Sipped on some cough syrup, took 2 Tylenol and drank some hot tea. Denies chest pain. He is unsure if he has been taking propanolol or not. He doesn't follow with a cardiologist.       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 Authorizing Provider  Insulin Degludec (TRESIBA FLEXTOUCH [place]) Inject 60 Units into the skin daily.   Yes [provider]  NOVOLOG FLEXPEN 100 UNIT/ML FlexPen Inject 28 Units into the skin 3 (three) times daily with meals. 07/30/20  Yes [provider]  Vitamin D, Ergocalciferol, (DRISDOL) 1.25 MG  (50000 UT) CAPS capsule Take 50,000 Units by mouth every 7 (seven) days.   Yes [provider]  aspirin EC 81 MG tablet Take 81 mg by mouth daily. Swallow whole.    [provider]  atorvastatin (LIPITOR) 40 MG tablet Take 1 tablet (40 mg total) by mouth daily. 11/28/22   Loyce Dys, MD  ipratropium (ATROVENT) 0.06 % nasal spray Place 2 sprays into both nostrils 4 (four) times daily as needed for rhinitis. Patient not taking: Reported on 11/27/2022 12/26/20   Tommie Sams, DO  lisinopril (ZESTRIL) 40 MG tablet Take 0.5 tablets (20 mg total) by mouth daily. 11/27/22 11/27/23  Loyce Dys, MD  metoprolol tartrate (LOPRESSOR) 25 MG tablet Take 0.5 tablets (12.5 mg total) by mouth 2 (two) times daily. 11/27/22   Loyce Dys, MD  nitroGLYCERIN (NITROSTAT) 0.4 MG SL tablet Place 1 tablet (0.4 mg total) under the tongue every 5 (five) minutes as needed for chest pain. 11/27/22   Loyce Dys, MD    Family History Family History  Problem Relation Age of Onset   Diabetes Mother    Cancer Mother    Colon cancer Father    Stroke Father    Diabetes Sister    Diabetes Sister    Diabetes Sister    Cancer Sister    Cancer Maternal Grandfather     Social  History Social History   Tobacco Use   Smoking status: Former   Smokeless tobacco: Never   Tobacco comments:    as teenager  Vaping Use   Vaping status: Never Used  Substance Use Topics   Alcohol use: Yes    Comment: Holidays   Drug use: Never     Allergies   Patient has no known allergies.   Review of Systems Review of Systems: negative unless otherwise stated in HPI.      Physical Exam Triage Vital Signs ED Triage Vitals  Enc Vitals Group     BP 11/26/22 1845 103/81     Pulse Rate 11/26/22 1845 (S) (!) 128     Resp --      Temp 11/26/22 1845 97.7 F (36.5 C)     Temp Source 11/26/22 1845 Oral     SpO2 11/26/22 1845 97 %     Weight --      Height --      Head Circumference --      Peak Flow --       Pain Score 11/26/22 1843 0     Pain Loc --      Pain Edu? --      Excl. in GC? --    No data found.  Updated Vital Signs BP 103/81 (BP Location: Left Arm)   Pulse (S) (!) 128   Temp 97.7 F (36.5 C) (Oral)   SpO2 97%   Visual Acuity Right Eye Distance:   Left Eye Distance:   Bilateral Distance:    Right Eye Near:   Left Eye Near:    Bilateral Near:     Physical Exam GEN:     alert, well appearing elderly male in no distress    HENT:  mucus membranes moist, no nasal discharge EYES:   no scleral injection or discharge NECK:  normal ROM, no goiter   RESP:  no increased work of breathing, clear to auscultation bilaterally CVS:   regular rhythm, tachycardic (HR 133) Skin:   warm and dry    UC Treatments / Results  Labs (all labs ordered are listed, but only abnormal results are displayed) Labs Reviewed  SARS CORONAVIRUS 2 BY RT PCR    EKG   Radiology DG Chest 2 View  Result Date: 11/26/2022 CLINICAL DATA:  Shortness of breath EXAM: CHEST - 2 VIEW COMPARISON:  Chest x-ray 08/09/2009 FINDINGS: The heart size and mediastinal contours are within normal limits. Both lungs are clear. The visualized skeletal structures are unremarkable. IMPRESSION: No active cardiopulmonary disease. Electronically Signed   By: Darliss Cheney M.D.   On: 11/26/2022 19:09    Procedures Procedures (including critical care time)  Medications Ordered in UC Medications - No data to display  Initial Impression / Assessment and Plan / UC Course  I have reviewed the triage vital signs and the nursing notes.  Pertinent labs & imaging results that were available during my care of the patient were reviewed by me and considered in my medical decision making (see chart for details).       Pt is a 82 y.o. male history of dysrhythmia, HTN, HLD, CKD,  and DM who presents for acute shortness of breath today with cough for the past 5 days. [name] is afebrile here without recent antipyretics. He is  tachycardic 114-133 here.  Initially satting 91-93 % has improved to 97% on room air.  On chart review, had an echo in May 2024 that showed LVEF greater  than 55% with grade 1 diastolic dysfunction.  There is no valvular stenosis but had mild valvular regurgitation.  Overall pt is non-toxic appearing, well hydrated, without respiratory distress. Pulmonary exam is unremarkable.   EKG showed  tachycardia with junctional rhythm.  No acute ST or T wave changes. He was previously on propanolol for dysrhythmia but he doesn't think he is taking this now.   Chest xray personally reviewed by me without focal pneumonia, pleural effusion, cardiomegaly or pneumothorax. Radiologist impression reviewed.  Given he is still tachycardic with his history recommended ED evaluation for labs and additional evaluation.  Pt and his wife agreed to travel via private vehicle to [facility]. Spoke with triage RN who will await pt's arrival.     Discussed MDM, treatment plan and plan for follow-up with patient who agrees with plan.     Final Clinical Impressions(s) / UC Diagnoses   Final diagnoses:  Shortness of breath  Tachycardia     Discharge Instructions      Your EKG is abnormal and your heart rate is fast.  Your chest x-ray did not show a pneumonia.  Your COVID test is negative.  You have been advised to follow up immediately in the emergency department for concerning signs or symptoms as discussed during your visit. If you declined EMS transport, please have a family member take you directly to the ED at this time. Do not delay.   Based on concerns about condition, if you do not follow up in the ED, you may risk poor outcomes including worsening of condition, delayed treatment and potentially life threatening issues. If you have declined to go to the ED at this time, you should call your PCP immediately to set up a follow up appointment.      ED Prescriptions   None    PDMP not reviewed this encounter.    Katha Cabal, DO 11/29/22 1453

## 2022-11-26 NOTE — ED Triage Notes (Signed)
Pt presents ambulatory to triage via POV with complaints of SOB x 1 week. Pt was seen at [facility] and was advised to come here for tachycardia. EKG obtained and reviewed by EDP. Endorses a persistent dry cough - COVID test was negative and CXR obtained at [facility] and can be seen in chart review. A&Ox4 at this time. Denies CP or fevers, chills, N/V/D.

## 2022-11-26 NOTE — ED Notes (Signed)
Patient is being discharged from the Urgent Care and sent to the Emergency Department via personal vehicle . Per Dr. Rachael Darby, patient is in need of higher level of care due to SOB and tachycardia. Patient is aware and verbalizes understanding of plan of care.  Vitals:   11/26/22 1845  BP: 103/81  Pulse: (S) (!) 128  Temp: 97.7 F (36.5 C)  SpO2: 97%

## 2022-11-26 NOTE — ED Provider Notes (Signed)
[facility] Provider Note    Event Date/Time   First MD Initiated Contact with Patient 11/26/22 2151     (approximate)   History   Shortness of Breath   HPI  Jordan Jennings is a 82 y.o. male with history of hypertension, diabetes presenting to the emergency department for evaluation of shortness of breath.  Patient's wife recently had respiratory symptoms, few days ago patient began to develop cough, does not this is starting to improve.  No associated fever.  Today had acutely worsened shortness of breath this morning.  Denies chest pain but does report palpitations.  He was initially seen in urgent care.  There, his heart rate varied from 114-133 with initial O2 sat of 91 to 93%.  In the setting of this, he was directed to the ER.    Physical Exam   Triage Vital Signs: ED Triage Vitals  Enc Vitals Group     BP 11/26/22 2016 110/77     Pulse Rate 11/26/22 2016 88     Resp 11/26/22 2016 18     Temp 11/26/22 2016 98 F (36.7 C)     Temp Source 11/26/22 2016 Oral     SpO2 11/26/22 2016 100 %     Weight 11/26/22 2014 192 lb 14.4 oz (87.5 kg)     Height 11/26/22 2014 5\' 9"  (1.753 m)     Head Circumference --      Peak Flow --      Pain Score 11/26/22 2014 0     Pain Loc --      Pain Edu? --      Excl. in GC? --     Most recent vital signs: Vitals:   11/26/22 2315 11/26/22 2329  BP:  (!) 147/86  Pulse: (!) 56   Resp: 17   Temp:    SpO2:       General: Awake, interactive  CV:  Regular rate, good peripheral perfusion.  Resp:  Lungs clear, unlabored respirations.  Abd:  Soft, nondistended.  Neuro:  Symmetric facial movement, fluid speech   ED Results / Procedures / Treatments   Labs (all labs ordered are listed, but only abnormal results are displayed) Labs Reviewed  BASIC METABOLIC PANEL - Abnormal; Notable for the following components:      Result Value   Glucose, Bld 207 (*)    BUN 31 (*)    Creatinine, Ser 1.88 (*)    GFR,  Estimated 35 (*)    All other components within normal limits  TROPONIN I (HIGH SENSITIVITY) - Abnormal; Notable for the following components:   Troponin I (High Sensitivity) 50 (*)    All other components within normal limits  TROPONIN I (HIGH SENSITIVITY) - Abnormal; Notable for the following components:   Troponin I (High Sensitivity) 67 (*)    All other components within normal limits  CBC  HEPATIC FUNCTION PANEL  TROPONIN I (HIGH SENSITIVITY)     EKG EKG independently reviewed interpreted by myself (ER attending) demonstrates:  EKG demonstrates sinus rhythm with first-degree AV block at a rate of 92, PR 212, QRS 94, QTc 455, no acute ST changes  RADIOLOGY Imaging independently reviewed and interpreted by myself demonstrates:  CTA of the chest without evidence of PE.  Radiology notes coronary artery atherosclerosis.  PROCEDURES:  Critical Care performed: No  Procedures   MEDICATIONS ORDERED IN ED: Medications  iohexol (OMNIPAQUE) 350 MG/ML injection 60 mL (60 mLs Intravenous Contrast Given 11/26/22 2224)  aspirin tablet 325 mg (325 mg Oral Given 11/26/22 2325)     IMPRESSION / MDM / ASSESSMENT AND PLAN / ED COURSE  I reviewed the triage vital signs and the nursing notes.  Differential diagnosis includes, but is not limited to, PE, ACS, arrhythmia, anemia, electrolyte abnormality  Patient's presentation is most consistent with acute presentation with potential threat to life or bodily function.  82 year old male presenting with shortness of breath in the setting of recent respiratory symptoms.  Stable vitals here, but reportedly tachycardic with heart rates up to the 130s in urgent care.  CBC here reassuring.  BMP with elevated creatinine at 1.88, previously 1 in April.  Initial troponin elevated at 50, increased to 67 on repeat.  Aspirin ordered.  CT of the chest without PE.  However in the setting of shortness of breath with positive troponin that is increasing, will  discuss with hospitalist team.  Case discussed with Dr. Allena Katz.  She will evaluate the patient for anticipated admission.      FINAL CLINICAL IMPRESSION(S) / ED DIAGNOSES   Final diagnoses:  SOB (shortness of breath)  Elevated troponin  Acute renal failure, unspecified acute renal failure type (HCC)     Rx / DC Orders   ED Discharge Orders     None        Note:  This document was prepared using Dragon voice recognition software and may include unintentional dictation errors.   Trinna Post, MD 11/26/22 [phone]

## 2022-11-26 NOTE — Discharge Instructions (Signed)
Your EKG is abnormal and your heart rate is fast.  Your chest x-ray did not show a pneumonia.  Your COVID test is negative.  You have been advised to follow up immediately in the emergency department for concerning signs or symptoms as discussed during your visit. If you declined EMS transport, please have a family member take you directly to the ED at this time. Do not delay.   Based on concerns about condition, if you do not follow up in the ED, you may risk poor outcomes including worsening of condition, delayed treatment and potentially life threatening issues. If you have declined to go to the ED at this time, you should call your PCP immediately to set up a follow up appointment.

## 2022-11-26 NOTE — ED Triage Notes (Signed)
Pt presents to UC c/o SOB. Pt denies any hx of COPD or asthma. Pt states sxs started out with a cold x1 week ago but have progressed. Persistent cough.

## 2022-11-26 NOTE — H&P (Signed)
History and Physical    Patient: Jordan Jennings WUJ:[id number] DOB: Jul 27, 1940 DOA: 11/26/2022 DOS: the patient was seen and examined on 11/27/2022 PCP: Patient, No Pcp Per  Patient coming from: Home   Chief Complaint:  Chief Complaint  Patient presents with   Shortness of Breath    HPI: Jordan Jennings is a 82 y.o. male with medical history significant for DM II coming for chest soreness and cough x 1 week and today he went to [facility] and was sent here for palpitation.    Review of Systems: Review of Systems  HENT:  Positive for congestion.   Respiratory:  Positive for cough.   Cardiovascular:  Positive for chest pain and palpitations.  All other systems reviewed and are negative.   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   Hypertension    Past Surgical History:  Procedure Laterality Date   CHOLECYSTECTOMY     COLONOSCOPY     COLONOSCOPY WITH PROPOFOL N/A 06/01/2016   Procedure: COLONOSCOPY WITH PROPOFOL;  Surgeon: Midge Minium, MD;  Location: [facility];  Service: Endoscopy;  Laterality: N/A;  Diabetic   Social History:  reports that he has quit smoking. He has never used smokeless tobacco. He reports current alcohol use. He reports that he does not use drugs.  No Known Allergies  History reviewed. No pertinent family history.  Prior to Admission medications   Medication Sig Start Date End Date Taking? Authorizing Provider  atorvastatin (LIPITOR) 10 MG tablet  07/18/20   [provider]  Baclofen 5 MG TABS Take 5 mg by mouth 2 (two) times daily. 02/07/21   Becky Augusta, NP  glipiZIDE (GLUCOTROL XL) 5 MG 24 hr tablet 2 (two) times daily.  04/13/16   [provider]  Insulin Degludec (TRESIBA FLEXTOUCH [place]) Inject into the skin daily.    [provider]  ipratropium (ATROVENT) 0.06 % nasal spray Place 2 sprays into both nostrils 4 (four)  times daily as needed for rhinitis. 12/26/20   Tommie Sams, DO  lisinopril-hydrochlorothiazide (PRINZIDE,ZESTORETIC) 20-25 MG tablet Take 1 tablet by mouth daily.    [provider]  methylPREDNISolone (MEDROL DOSEPAK) 4 MG TBPK tablet Take according to the package insert. 02/07/21   Becky Augusta, NP  NOVOLOG FLEXPEN 100 UNIT/ML FlexPen  07/30/20   [provider]  PROPRANOLOL HCL PO Take 10 mg by mouth 3 (three) times daily.     [provider]  Vitamin D, Ergocalciferol, (DRISDOL) 1.25 MG (50000 UT) CAPS capsule Take 50,000 Units by mouth every 7 (seven) days.    [provider]     Vitals:   11/26/22 2329 11/26/22 2330 11/27/22 0000 11/27/22 0030  BP: (!) 147/86 130/87 (!) 146/84 138/85  Pulse:  72 72 70  Resp:  20 19 17   Temp:      TempSrc:      SpO2:      Weight:      Height:       Physical Exam Vitals and nursing note reviewed.  Constitutional:      General: He is not in acute distress. HENT:     Head: Normocephalic and atraumatic.     Right Ear: Hearing normal.     Left Ear: Hearing normal.     Nose: Nose normal. No nasal deformity.     Mouth/Throat:     Lips: Pink.  Tongue: No lesions.     Pharynx: Oropharynx is clear.  Eyes:     General: Lids are normal.     Extraocular Movements: Extraocular movements intact.  Cardiovascular:     Rate and Rhythm: Normal rate and regular rhythm.     Heart sounds: Normal heart sounds.  Pulmonary:     Effort: Pulmonary effort is normal.     Breath sounds: Normal breath sounds.  Abdominal:     General: Bowel sounds are normal. There is no distension.     Palpations: Abdomen is soft. There is no mass.     Tenderness: There is no abdominal tenderness.  Musculoskeletal:     Right lower leg: No edema.     Left lower leg: No edema.  Skin:    General: Skin is warm.  Neurological:     General: No focal deficit present.     Mental Status: He is alert and oriented to person, place, and time.      Cranial Nerves: Cranial nerves 2-12 are intact.  Psychiatric:        Attention and Perception: Attention normal.        Mood and Affect: Mood normal.        Speech: Speech normal.        Behavior: Behavior normal. Behavior is cooperative.      Labs on Admission: I have personally reviewed following labs and imaging studies  CBC: Recent Labs  Lab 11/26/22 2019  WBC 8.6  HGB 13.0  HCT 39.8  MCV 84.0  PLT 323   Basic Metabolic Panel: Recent Labs  Lab 11/26/22 2019  NA 139  K 3.8  CL 104  CO2 25  GLUCOSE 207*  BUN 31*  CREATININE 1.88*  CALCIUM 9.5   GFR: Estimated Creatinine Clearance: 32.6 mL/min (A) (by C-G formula based on SCr of 1.88 mg/dL (H)). Liver Function Tests: Recent Labs  Lab 11/26/22 2329  AST 20  ALT 21  ALKPHOS 59  BILITOT 0.9  PROT 7.0  ALBUMIN 3.5   No results for input(s): "LIPASE", "AMYLASE" in the last 168 hours. No results for input(s): "AMMONIA" in the last 168 hours. Coagulation Profile: No results for input(s): "INR", "PROTIME" in the last 168 hours. Cardiac Enzymes: Recent Labs  Lab 11/26/22 2205  CKTOTAL 371   BNP (last 3 results) No results for input(s): "PROBNP" in the last 8760 hours. HbA1C: No results for input(s): "HGBA1C" in the last 72 hours. CBG: No results for input(s): "GLUCAP" in the last 168 hours. Lipid Profile: No results for input(s): "CHOL", "HDL", "LDLCALC", "TRIG", "CHOLHDL", "LDLDIRECT" in the last 72 hours. Thyroid Function Tests: Recent Labs    11/26/22 2205  TSH 3.114  FREET4 0.76   Anemia Panel: No results for input(s): "VITAMINB12", "FOLATE", "FERRITIN", "TIBC", "IRON", "RETICCTPCT" in the last 72 hours. Urine analysis: Urinalysis No results found for: "COLORURINE", "APPEARANCEUR", "LABSPEC", "PHURINE", "GLUCOSEU", "HGBUR", "BILIRUBINUR", "KETONESUR", "PROTEINUR", "UROBILINOGEN", "NITRITE", "LEUKOCYTESUR"    Radiological Exams on Admission: CT Angio Chest PE W and/or Wo Contrast  Result  Date: 11/26/2022 CLINICAL DATA:  Pulmonary embolism (PE) suspected, high prob. Shortness of breath EXAM: CT ANGIOGRAPHY CHEST WITH CONTRAST TECHNIQUE: Multidetector CT imaging of the chest was performed using the standard protocol during bolus administration of intravenous contrast. Multiplanar CT image reconstructions and MIPs were obtained to evaluate the vascular anatomy. RADIATION DOSE REDUCTION: This exam was performed according to the departmental dose-optimization program which includes automated exposure control, adjustment of the mA and/or kV according to  patient size and/or use of iterative reconstruction technique. CONTRAST:  60mL OMNIPAQUE IOHEXOL 350 MG/ML SOLN COMPARISON:  None Available. FINDINGS: Cardiovascular: No filling defects in the pulmonary arteries to suggest pulmonary emboli. Heart is normal size. Aorta is normal caliber. Coronary artery and aortic atherosclerosis. Mediastinum/Nodes: No mediastinal, hilar, or axillary adenopathy. Trachea and esophagus are unremarkable. Thyroid unremarkable. Lungs/Pleura: Mild centrilobular emphysema. No confluent airspace opacities or effusions. Upper Abdomen: No acute findings Musculoskeletal: Chest wall soft tissues are unremarkable. No acute bony abnormality. Review of the MIP images confirms the above findings. IMPRESSION: No evidence of pulmonary embolus. No acute cardiopulmonary disease. Scattered coronary artery disease. Aortic Atherosclerosis (ICD10-I70.0) and Emphysema (ICD10-J43.9). Electronically Signed   By: Charlett Nose M.D.   On: 11/26/2022 22:42   DG Chest 2 View  Result Date: 11/26/2022 CLINICAL DATA:  Shortness of breath EXAM: CHEST - 2 VIEW COMPARISON:  Chest x-ray 08/09/2009 FINDINGS: The heart size and mediastinal contours are within normal limits. Both lungs are clear. The visualized skeletal structures are unremarkable. IMPRESSION: No active cardiopulmonary disease. Electronically Signed   By: Darliss Cheney M.D.   On: 11/26/2022 19:09      Data Reviewed: Relevant notes from primary care and specialist visits, past discharge summaries as available in EHR, including Care Everywhere. Prior diagnostic testing as pertinent to current admission diagnoses Updated medications and problem lists for reconciliation ED course, including vitals, labs, imaging, treatment and response to treatment Triage notes, nursing and pharmacy notes and ED provider's notes Notable results as noted in HPI Assessment and Plan: * Chest pain Pt described chest soreness with coughing. And d/w him that he is moderate risk for cardiovascular disease and this may be unstable angina presentation .We will admit to tele unit. Cardiology consult.  NTG PRN. Troponin rising mildly , will hold heparin gtt for now and start dvt prophylaxis.  Asa/ prn ngt and morphine.   Hypertension associated with type 2 diabetes mellitus (HCC) Home regimen of diuretic and ace held due to [facility].   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  We will monitor and avoid contrast studies.  Renally dose meds   DVT prophylaxis:  Heparin   Consults:  Cardiology [facility].   Advance Care Planning:    Code Status: Full Code   Family Communication:  Wife and daughter  at bedside.  Disposition Plan:  Back to previous home environment  Severity of Illness: The appropriate patient status for this patient is OBSERVATION. Observation status is judged to be reasonable and necessary in order to provide the required intensity of service to ensure the patient's safety. The patient's presenting symptoms, physical exam findings, and initial radiographic and laboratory data in the context of their medical condition is felt to place them at decreased risk for further clinical deterioration. Furthermore, it is anticipated that the patient will  be medically stable for discharge from the hospital within 2 midnights of admission.   Author: Gertha Calkin, MD 11/27/2022 1:36 AM  For on call review [email or web address].

## 2022-11-27 ENCOUNTER — Telehealth: Payer: Self-pay | Admitting: Physician Assistant

## 2022-11-27 ENCOUNTER — Encounter: Payer: Self-pay | Admitting: Internal Medicine

## 2022-11-27 ENCOUNTER — Other Ambulatory Visit: Payer: Self-pay

## 2022-11-27 DIAGNOSIS — E785 Hyperlipidemia, unspecified: Secondary | ICD-10-CM

## 2022-11-27 DIAGNOSIS — E1159 Type 2 diabetes mellitus with other circulatory complications: Secondary | ICD-10-CM | POA: Diagnosis present

## 2022-11-27 DIAGNOSIS — R7989 Other specified abnormal findings of blood chemistry: Secondary | ICD-10-CM | POA: Diagnosis not present

## 2022-11-27 DIAGNOSIS — I2510 Atherosclerotic heart disease of native coronary artery without angina pectoris: Secondary | ICD-10-CM

## 2022-11-27 DIAGNOSIS — I10 Essential (primary) hypertension: Secondary | ICD-10-CM

## 2022-11-27 DIAGNOSIS — R079 Chest pain, unspecified: Secondary | ICD-10-CM

## 2022-11-27 DIAGNOSIS — E1122 Type 2 diabetes mellitus with diabetic chronic kidney disease: Secondary | ICD-10-CM | POA: Diagnosis present

## 2022-11-27 LAB — CBC
HCT: 38.7 % — ABNORMAL LOW (ref 39.0–52.0)
Hemoglobin: 12.2 g/dL — ABNORMAL LOW (ref 13.0–17.0)
MCH: 26.7 pg (ref 26.0–34.0)
MCHC: 31.5 g/dL (ref 30.0–36.0)
MCV: 84.7 fL (ref 80.0–100.0)
Platelets: 280 10*3/uL (ref 150–400)
RBC: 4.57 MIL/uL (ref 4.22–5.81)
RDW: 14.4 % (ref 11.5–15.5)
WBC: 6.3 10*3/uL (ref 4.0–10.5)
nRBC: 0 % (ref 0.0–0.2)

## 2022-11-27 LAB — COMPREHENSIVE METABOLIC PANEL
ALT: 20 U/L (ref 0–44)
AST: 21 U/L (ref 15–41)
Albumin: 3.4 g/dL — ABNORMAL LOW (ref 3.5–5.0)
Alkaline Phosphatase: 61 U/L (ref 38–126)
Anion gap: 10 (ref 5–15)
BUN: 26 mg/dL — ABNORMAL HIGH (ref 8–23)
CO2: 25 mmol/L (ref 22–32)
Calcium: 8.9 mg/dL (ref 8.9–10.3)
Chloride: 102 mmol/L (ref 98–111)
Creatinine, Ser: 1.18 mg/dL (ref 0.61–1.24)
GFR, Estimated: >60 mL/min (ref >60)
Glucose, Bld: 193 mg/dL — ABNORMAL HIGH (ref 70–99)
Potassium: 3.1 mmol/L — ABNORMAL LOW (ref 3.5–5.1)
Sodium: 137 mmol/L (ref 135–145)
Total Bilirubin: 0.9 mg/dL (ref 0.3–1.2)
Total Protein: 7.2 g/dL (ref 6.5–8.1)

## 2022-11-27 LAB — HEPATIC FUNCTION PANEL
ALT: 21 U/L (ref 0–44)
AST: 20 U/L (ref 15–41)
Albumin: 3.5 g/dL (ref 3.5–5.0)
Alkaline Phosphatase: 59 U/L (ref 38–126)
Bilirubin, Direct: <0.1 mg/dL (ref 0.0–0.2)
Total Bilirubin: 0.9 mg/dL (ref 0.3–1.2)
Total Protein: 7 g/dL (ref 6.5–8.1)

## 2022-11-27 LAB — LIPID PANEL
Cholesterol: 105 mg/dL (ref 0–200)
HDL: 30 mg/dL — ABNORMAL LOW (ref >40)
LDL Cholesterol: 59 mg/dL (ref 0–99)
Total CHOL/HDL Ratio: 3.5 RATIO
Triglycerides: 82 mg/dL (ref <150)
VLDL: 16 mg/dL (ref 0–40)

## 2022-11-27 LAB — CK: Total CK: 371 U/L (ref 49–397)

## 2022-11-27 LAB — TROPONIN I (HIGH SENSITIVITY): Troponin I (High Sensitivity): 57 ng/L — ABNORMAL HIGH (ref <18)

## 2022-11-27 LAB — T4, FREE: Free T4: 0.76 ng/dL (ref 0.61–1.12)

## 2022-11-27 LAB — CBG MONITORING, ED
Glucose-Capillary: 172 mg/dL — ABNORMAL HIGH (ref 70–99)
Glucose-Capillary: 213 mg/dL — ABNORMAL HIGH (ref 70–99)

## 2022-11-27 LAB — TSH: TSH: 3.114 u[IU]/mL (ref 0.350–4.500)

## 2022-11-27 MED ORDER — ALBUTEROL SULFATE (2.5 MG/3ML) 0.083% IN NEBU: 2.5000 mg | INHALATION_SOLUTION | RESPIRATORY_TRACT | Status: DC | PRN

## 2022-11-27 MED ORDER — INSULIN ASPART 100 UNIT/ML IJ SOLN
0.0000 [IU] | Freq: Three times a day (TID) | INTRAMUSCULAR | Status: DC
  Administered 2022-11-27: 5 [IU] via SUBCUTANEOUS
  Administered 2022-11-27: 3 [IU] via SUBCUTANEOUS
  Filled 2022-11-27: qty 1

## 2022-11-27 MED ORDER — SODIUM CHLORIDE 0.9 % IV SOLN: 250.0000 mL | INTRAVENOUS | Status: DC | PRN

## 2022-11-27 MED ORDER — NITROGLYCERIN 0.4 MG SL SUBL: 0.4000 mg | SUBLINGUAL_TABLET | SUBLINGUAL | Status: DC | PRN

## 2022-11-27 MED ORDER — SODIUM CHLORIDE 0.9% FLUSH: 3.0000 mL | Freq: Two times a day (BID) | INTRAVENOUS | Status: DC

## 2022-11-27 MED ORDER — LISINOPRIL 40 MG PO TABS: 20.0000 mg | ORAL_TABLET | Freq: Every day | ORAL | 0 refills | Status: AC

## 2022-11-27 MED ORDER — MORPHINE SULFATE (PF) 2 MG/ML IV SOLN: 2.0000 mg | INTRAVENOUS | Status: DC | PRN

## 2022-11-27 MED ORDER — SODIUM CHLORIDE 0.9% FLUSH: 3.0000 mL | INTRAVENOUS | Status: DC | PRN

## 2022-11-27 MED ORDER — ASPIRIN 81 MG PO TBEC
81.0000 mg | DELAYED_RELEASE_TABLET | Freq: Every day | ORAL | Status: DC
  Administered 2022-11-27: 81 mg via ORAL
  Filled 2022-11-27: qty 1

## 2022-11-27 MED ORDER — ATORVASTATIN CALCIUM 20 MG PO TABS
40.0000 mg | ORAL_TABLET | Freq: Every day | ORAL | Status: DC
  Administered 2022-11-27: 40 mg via ORAL
  Filled 2022-11-27: qty 2

## 2022-11-27 MED ORDER — METOPROLOL TARTRATE 25 MG PO TABS
12.5000 mg | ORAL_TABLET | Freq: Two times a day (BID) | ORAL | Status: DC
  Administered 2022-11-27 (×2): 12.5 mg via ORAL
  Filled 2022-11-27 (×2): qty 1

## 2022-11-27 MED ORDER — NITROGLYCERIN 0.4 MG SL SUBL: 0.4000 mg | SUBLINGUAL_TABLET | SUBLINGUAL | 1 refills | Status: AC | PRN

## 2022-11-27 MED ORDER — METOPROLOL TARTRATE 25 MG PO TABS: 12.5000 mg | ORAL_TABLET | Freq: Two times a day (BID) | ORAL | 0 refills | Status: AC

## 2022-11-27 MED ORDER — ACETAMINOPHEN 325 MG RE SUPP: 650.0000 mg | Freq: Four times a day (QID) | RECTAL | Status: DC | PRN

## 2022-11-27 MED ORDER — POTASSIUM CHLORIDE CRYS ER 20 MEQ PO TBCR
40.0000 meq | EXTENDED_RELEASE_TABLET | ORAL | Status: AC
  Administered 2022-11-27 (×2): 40 meq via ORAL
  Filled 2022-11-27 (×2): qty 2

## 2022-11-27 MED ORDER — ALBUTEROL SULFATE HFA 108 (90 BASE) MCG/ACT IN AERS: 1.0000 | INHALATION_SPRAY | RESPIRATORY_TRACT | Status: DC | PRN

## 2022-11-27 MED ORDER — HYDRALAZINE HCL 20 MG/ML IJ SOLN: 10.0000 mg | Freq: Four times a day (QID) | INTRAMUSCULAR | Status: DC | PRN

## 2022-11-27 MED ORDER — ACETAMINOPHEN 325 MG PO TABS: 650.0000 mg | ORAL_TABLET | Freq: Four times a day (QID) | ORAL | Status: DC | PRN

## 2022-11-27 MED ORDER — HEPARIN SODIUM (PORCINE) 5000 UNIT/ML IJ SOLN
5000.0000 [IU] | Freq: Three times a day (TID) | INTRAMUSCULAR | Status: DC
  Administered 2022-11-27 (×2): 5000 [IU] via SUBCUTANEOUS
  Filled 2022-11-27 (×2): qty 1

## 2022-11-27 MED ORDER — PANTOPRAZOLE SODIUM 40 MG IV SOLR
40.0000 mg | Freq: Two times a day (BID) | INTRAVENOUS | Status: DC
  Filled 2022-11-27 (×2): qty 10

## 2022-11-27 MED ORDER — ATORVASTATIN CALCIUM 40 MG PO TABS: 40.0000 mg | ORAL_TABLET | Freq: Every day | ORAL | 0 refills | Status: AC

## 2022-11-27 NOTE — Assessment & Plan Note (Signed)
Pt has h/o CKD stage 2 and today creatinine is wnl.  We will monitor and avoid contrast studies.  Renally dose meds

## 2022-11-27 NOTE — Telephone Encounter (Signed)
Patient seen in consult in the ED. Please order a Lexiscan MPI to be performed within the next couple of days, if possible. Patient will need follow up with Dr. Okey Dupre or myself thereafter. Dx: chest pain with moderate risk for cardiac etiology.

## 2022-11-27 NOTE — Consult Note (Signed)
Cardiology Consultation:   Patient ID: [name]; [id number]; April 04, 1941   Admit date: 11/26/2022 Date of Consult: 11/27/2022  Primary Care Provider: Kipp Jennings) Primary Cardiologist: New - consult by End Primary Electrophysiologist:  None   Patient Profile:   Jordan Jennings is a 82 y.o. male with a hx of coronary artery calcification noted on CT imaging, aortic atherosclerosis, DM, HTN, HLD, prior tobacco use quitting 40 years ago who is being seen today for the evaluation of elevated troponin at the request of Dr. Allena Katz.  History of Present Illness:   Mr. Jordan Jennings has no previously known cardiac history.  He was evaluated by his PCP in 08/2022 with bilateral ankle swelling after coming off HCTZ and while on amlodipine.  BNP normal at 25.  Echo performed at [facility] in 09/2022, showed an EF of greater than 55%, normal wall motion, grade 1 diastolic dysfunction, trivial aortic insufficiency and mitral regurgitation, mild tricuspid regurgitation, and no pericardial effusion.   He presented to urgent care on 11/26/2022 with complaints of of cough for 5 days and shortness of breath without chest pain.  BP 1 3/81 with a heart rate of 128 bpm.  Initial oxygen saturation 91 to 93% improved to 97% on room air.  He was subsequently transferred to the ED for further evaluation given dyspnea and a tachycardic rate.  Upon arrival to the ED blood pressure was 110/77 with a heart rate of 88 bpm.  Oxygen saturation 100% on room air.  High-sensitivity troponin 58 with a delta troponin of 67, peaking at 74 with subsequent downtrend.  EKG showed sinus rhythm, 92 bpm, first-degree AV block, possible prior anterolateral infarct, poor R wave progression along the precordial leads, and baseline artifact.  Chest x-ray showed no acute cardiopulmonary disease.  CTA of the chest was negative for PE with coronary artery calcification and aortic atherosclerosis noted.  Patient is seen in the ED today with wife and daughter  present.  She reports around 130 to 2 PM on 7/8, he developed sudden onset of shortness of breath and fatigue.  Symptoms persisted while he was at the [employer]und[employer].  Patient's wife indicates the patient was fatigued and seemed to be a little confused.  In this setting, he was taken to the urgent care with subsequent transition to the ED as outlined above.  In total, patient states symptoms of fatigue and shortness of breath lasted for several hours followed by resolution.  He does note some chest soreness in the setting of deep cough over the past 6 days.  He reports his wife was recently sick with a URI.  He has been without exertional chest pain and is very active at baseline.  Prefers to go home today.    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   Hypertension     Past Surgical History:  Procedure Laterality Date   CHOLECYSTECTOMY     COLONOSCOPY     COLONOSCOPY WITH PROPOFOL N/A 06/01/2016   Procedure: COLONOSCOPY WITH PROPOFOL;  Surgeon: Jordan Minium, MD;  [facility];  Service: Endoscopy;  Laterality: N/A;  Diabetic     Home Meds: Prior to Admission medications   Medication Sig Start Date End Date Taking? Authorizing Provider  aspirin EC 81 MG tablet Take 81 mg by mouth daily. Swallow whole.   Yes [provider]  atorvastatin (LIPITOR) 10 MG tablet Take 10  mg by mouth daily. 07/18/20  Yes [provider]  hydrochlorothiazide (HYDRODIURIL) 25 MG tablet Take 25 mg by mouth daily.   Yes [provider]  Insulin Degludec (TRESIBA FLEXTOUCH [place]) Inject 60 Units into the skin daily.   Yes [provider]  lisinopril (ZESTRIL) 40 MG tablet Take 40 mg by mouth daily. 11/09/22 11/09/23 Yes [provider]  NOVOLOG FLEXPEN 100 UNIT/ML FlexPen Inject 28 Units into the skin 3 (three) times daily with meals. 07/30/20  Yes [provider]  Vitamin D, Ergocalciferol, (DRISDOL) 1.25 MG (50000 UT) CAPS capsule Take 50,000 Units by mouth every 7 (seven) days.   Yes [provider]  Baclofen 5 MG TABS Take 5 mg by mouth 2 (two) times daily. Patient not taking: Reported on 11/27/2022 02/07/21   Jordan Augusta, NP  ipratropium (ATROVENT) 0.06 % nasal spray Place 2 sprays into both nostrils 4 (four) times daily as needed for rhinitis. Patient not taking: Reported on 11/27/2022 12/26/20   Jordan Sams, DO  lisinopril-hydrochlorothiazide (PRINZIDE,ZESTORETIC) 20-25 MG tablet Take 1 tablet by mouth daily. Patient not taking: Reported on 11/27/2022    [provider]  methylPREDNISolone (MEDROL DOSEPAK) 4 MG TBPK tablet Take according to the package insert. Patient not taking: Reported on 11/27/2022 02/07/21   Jordan Augusta, NP  PROPRANOLOL HCL PO Take 10 mg by mouth 3 (three) times daily.  Patient not taking: Reported on 11/27/2022    [provider]    Inpatient Medications: Scheduled Meds:  aspirin EC  81 mg Oral Daily   atorvastatin  40 mg Oral Daily   heparin  5,000 Units Subcutaneous Q8H   insulin aspart  0-15 Units Subcutaneous TID WC   metoprolol tartrate  12.5 mg Oral BID   pantoprazole (PROTONIX) IV  40 mg Intravenous Q12H   sodium chloride flush  3 mL Intravenous Q12H   sodium chloride flush  3 mL Intravenous Q12H   Continuous Infusions:  sodium chloride     PRN Meds: sodium chloride, acetaminophen **OR** acetaminophen, albuterol, hydrALAZINE, morphine injection, nitroGLYCERIN, sodium chloride flush  Allergies:  No Known Allergies  Social History:   Social History   Socioeconomic History   Marital status: Married    Spouse name: Not on file   Number of children: Not on file   Years of education: Not on file   Highest education level: Not on file  Occupational History   Not on file  Tobacco Use   Smoking status: Former   Smokeless tobacco: Never   Tobacco comments:    as teenager  Vaping  Use   Vaping Use: Never used  Substance and Sexual Activity   Alcohol use: Yes    Comment: Holidays   Drug use: Never   Sexual activity: Not on file  Other Topics Concern   Not on file  Social History Narrative   Not on file   Social Determinants of Health   Financial Resource Strain: Not on file  Food Insecurity: Not on file  Transportation Needs: Not on file  Physical Activity: Not on file  Stress: Not on file  Social Connections: Not on file  Intimate Partner Violence: Not on file     Family History:   Family History  Problem Relation Age of Onset   Diabetes Mother    Cancer Mother    Colon cancer Father    Stroke Father    Diabetes Sister    Diabetes Sister    Diabetes Sister  Cancer Sister    Cancer Maternal Grandfather     ROS:  Review of Systems  Constitutional:  Positive for malaise/fatigue. Negative for chills, diaphoresis, fever and weight loss.  HENT:  Negative for congestion.   Eyes:  Negative for discharge and redness.  Respiratory:  Positive for cough and shortness of breath. Negative for hemoptysis, sputum production and wheezing.   Cardiovascular:  Positive for chest pain. Negative for palpitations, orthopnea, claudication, leg swelling and PND.       Chest soreness associated with cough  Gastrointestinal:  Negative for abdominal pain, heartburn, nausea and vomiting.  Musculoskeletal:  Negative for falls and myalgias.  Skin:  Negative for rash.  Neurological:  Positive for weakness. Negative for dizziness, tingling, tremors, sensory change, speech change, focal weakness and loss of consciousness.  Endo/Heme/Allergies:  Does not bruise/bleed easily.  Psychiatric/Behavioral:  Negative for substance abuse. The patient is not nervous/anxious.       Physical Exam/Data:   Vitals:   11/27/22 1030 11/27/22 1100 11/27/22 1130 11/27/22 1252  BP: 135/73 130/71 124/76   Pulse: (!) 56 (!) 57 (!) 56   Resp: 10 18 13    Temp:    98 F (36.7 C)  TempSrc:     Oral  SpO2:   96%   Weight:      Height:       No intake or output data in the 24 hours ending 11/27/22 1524 Filed Weights   11/26/22 2014  Weight: 87.5 kg   Body mass index is 28.49 kg/m.   Physical Exam: General: Well developed, well nourished, in no acute distress. Head: Normocephalic, atraumatic, sclera non-icteric, no xanthomas, nares without discharge.  Neck: Negative for carotid bruits. JVD not elevated. Lungs: Clear bilaterally to auscultation without wheezes, rales, or rhonchi. Breathing is unlabored. Heart: RRR with S1 S2. No murmurs, rubs, or gallops appreciated. Abdomen: Soft, non-tender, non-distended with normoactive bowel sounds. No hepatomegaly. No rebound/guarding. No obvious abdominal masses. Msk:  Strength and tone appear normal for age. Extremities: No clubbing or cyanosis. No edema. Distal pedal pulses are 2+ and equal bilaterally. Neuro: Alert and oriented X 3. No facial asymmetry. No focal deficit. Moves all extremities spontaneously. Psych:  Responds to questions appropriately with a normal affect.   EKG:  The EKG was personally reviewed and demonstrates: sinus rhythm, 92 bpm, first-degree AV block, possible prior anterolateral infarct, poor R wave progression along the precordial leads, and baseline artifact Telemetry:  Telemetry was personally reviewed and demonstrates: SR with sinus bradycardia, 50s to 70s bpm  Weights: Filed Weights   11/26/22 2014  Weight: 87.5 kg    Relevant CV Studies:  2D echo 09/27/2022 Gavin Potters): ECHOCARDIOGRAPHIC DESCRIPTIONS  AORTIC ROOT                   Size: Normal             Dissection: INDETERM FOR DISSECTION  AORTIC VALVE               Leaflets: Tricuspid                   Morphology: Normal               Mobility: Fully mobile  LEFT VENTRICLE                   Size: Normal  Anterior: Normal             Closest EF: >55% (Estimated)               Lateral: Normal              LV Masses:  No Masses                       Septal: Normal                    LVH: None                            Apical: Normal                                                       Inferior: Normal                                                      Posterior: Normal           Dias.FxClass: (Grade 1) relaxation abnormal, E/A reversal            Contraction: Normal  MITRAL VALVE               Leaflets: Normal                        Mobility: Fully mobile             Morphology: Normal  LEFT ATRIUM                   Size: Normal                       LA Masses: No masses              IA Septum: Normal IAS  MAIN PA                   Size: Normal  PULMONIC VALVE             Morphology: Normal                        Mobility: Fully mobile  RIGHT VENTRICLE              RV Masses: No Masses                         Size: Normal              Free Wall: Normal                     Contraction: Normal  TRICUSPID VALVE               Leaflets: Normal                        Mobility: Fully mobile             Morphology:  Normal  RIGHT ATRIUM                   Size: Normal                        RA Other: None                RA Mass: No masses  PERICARDIUM                 Fluid: No effusion  INFERIOR VENACAVA                   Size: Normal Normal respiratory collapse  _________________________________________________________________________________________   DOPPLER ECHO and OTHER SPECIAL PROCEDURES                 Aortic: TRIVIAL AR                 No AS                 Mitral: TRIVIAL MR                 No MS                         MV Inflow E Vel = 68.7 cm/sec     MV Annulus E'Vel = 3.6 cm/sec                         E/E'Ratio = 19.1              Tricuspid: MILD TR                    No TS                         239.0 cm/sec peak TR vel   25.8 mmHg peak RV pressure              Pulmonary: TRIVIAL PR                 No PS   _________________________________________________________________________________________  INTERPRETATION  NORMAL LEFT VENTRICULAR SYSTOLIC FUNCTION  NORMAL RIGHT VENTRICULAR SYSTOLIC FUNCTION  MILD VALVULAR REGURGITATION (See above)  NO VALVULAR STENOSIS   Laboratory Data:  Chemistry Recent Labs  Lab 11/26/22 2019 11/27/22 0446  NA 139 137  K 3.8 3.1*  CL 104 102  CO2 25 25  GLUCOSE 207* 193*  BUN 31* 26*  CREATININE 1.88* 1.18  CALCIUM 9.5 8.9  GFRNONAA 35* >60  ANIONGAP 10 10    Recent Labs  Lab 11/26/22 2329 11/27/22 0446  PROT 7.0 7.2  ALBUMIN 3.5 3.4*  AST 20 21  ALT 21 20  ALKPHOS 59 61  BILITOT 0.9 0.9   Hematology Recent Labs  Lab 11/26/22 2019 11/27/22 0446  WBC 8.6 6.3  RBC 4.74 4.57  HGB 13.0 12.2*  HCT 39.8 38.7*  MCV 84.0 84.7  MCH 27.4 26.7  MCHC 32.7 31.5  RDW 14.4 14.4  PLT 323 280   Cardiac EnzymesNo results for input(s): "TROPONINI" in the last 168 hours. No results for input(s): "TROPIPOC" in the last 168 hours.  BNPNo results for input(s): "BNP", "PROBNP" in the last 168 hours.  DDimer No results for input(s): "DDIMER" in the last 168 hours.  Radiology/Studies:  CT Angio Chest PE W and/or Wo Contrast  Result Date: 11/26/2022  IMPRESSION: No evidence of pulmonary embolus. No acute cardiopulmonary disease. Scattered coronary artery disease. Aortic Atherosclerosis (ICD10-I70.0) and Emphysema (ICD10-J43.9). Electronically Signed   By: Charlett Nose M.D.   On: 11/26/2022 22:42   DG Chest 2 View  Result Date: 11/26/2022 IMPRESSION: No active cardiopulmonary disease. Electronically Signed   By: Darliss Cheney M.D.   On: 11/26/2022 19:09    Assessment and Plan:   1.  Coronary artery calcification with elevated high-sensitivity troponin: -Patient reported sudden onset of exertional fatigue and shortness of breath on 7/8 that persisted for several hours followed by resolution -Symptoms appear to coincide with possible URI -He has noted some  chest discomfort, that is associated with cough and not exertional -High-sensitivity troponin is mildly elevated and flat trending, peaking at 74, not consistent with ACS -Recent echo demonstrated preserved LV systolic function -We discussed invasive and noninvasive ischemic testing modalities -Patient would prefer to be discharged home today and follow-up with a Lexiscan MPI in the next couple of days rather than remaining admitted for nuclear stress test on 7/10 -Our office will arrange Lexiscan MPI and follow-up thereafter -Strict ED precautions were discussed -Continue ASA 81 mg daily -Recommend providing a prescription of SL NTG at discharge  2. HLD: -LDL 59 -Atorvastatin 40 mg  3. HTN: -Blood pressure well-controlled -PTA lisinopril and metoprolol tartrate  4.  URI: -Appears to be improving   Informed Consent   Shared Decision Making/Informed Consent{  The risks [chest pain, shortness of breath, cardiac arrhythmias, dizziness, blood pressure fluctuations, myocardial infarction, stroke/transient ischemic attack, nausea, vomiting, allergic reaction, radiation exposure, metallic taste sensation and life-threatening complications (estimated to be 1 in 10,000)], benefits (risk stratification, diagnosing coronary artery disease, treatment guidance) and alternatives of a nuclear stress test were discussed in detail with Mr. Soward and he agrees to proceed.       For questions or updates, please contact [facility] Please consult [email or web address] for contact info under Cardiology/STEMI.   Signed, Eula Listen, PA-C [facility] Pager: [phone] 11/27/2022, 3:24 PM

## 2022-11-27 NOTE — Assessment & Plan Note (Signed)
NPO/ glycemic protocol.

## 2022-11-27 NOTE — Discharge Summary (Signed)
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 male with medical history significant for DM II coming for chest soreness and cough x 1 week and today he went to [facility] and was sent here for palpitation.   82 years old male with above-mentioned past medical history who was otherwise well until within the last few days when he started experiencing shortness of breath with exertion.  According to patient the shortness of breath did not improve and subsequently he went to facility in [facility] and was subsequently sent here for further management.  Patient denied chest pain, cough, nausea vomiting abdominal pain or urinary complaints.  According to patient he also had associated palpitation.  Upon arrival to the emergency room temperature 97.7 pulse 128 blood pressure 103/81 saturating 97% on room air.  Troponin was found to be slightly elevated 50 --67--74--57. EKG did not show any ST segment changes.  Patient was seen by cardiologist and scheduled to have cardiac stress test which he agreed to do this as an outpatient.  He is therefore being discharged home on below medications as recommended by cardiology and to follow-up.   Consultants: Cardiology Procedures performed: None Disposition: Home Diet recommendation:  Cardiac diet DISCHARGE MEDICATION: Allergies as  of 11/27/2022   No Known Allergies      Medication List     STOP taking these medications    Baclofen 5 MG Tabs   hydrochlorothiazide 25 MG tablet Commonly known as: HYDRODIURIL   lisinopril-hydrochlorothiazide 20-25 MG tablet Commonly known as: ZESTORETIC   methylPREDNISolone 4 MG Tbpk tablet Commonly known as: MEDROL DOSEPAK   PROPRANOLOL HCL PO       TAKE these medications    aspirin EC 81 MG tablet Take 81 mg by mouth daily. Swallow whole.   atorvastatin 40 MG tablet Commonly known as: LIPITOR Take 1 tablet (40 mg total) by mouth daily. Start taking on: November 28, 2022 What changed:  medication strength how much to take   ipratropium 0.06 % nasal spray Commonly known as: ATROVENT Place 2 sprays into both nostrils 4 (four) times daily as needed for rhinitis.   lisinopril 40 MG tablet Commonly known as: ZESTRIL Take 0.5 tablets (20 mg total) by mouth daily. What changed: how much to take   metoprolol tartrate 25 MG tablet Commonly known as: LOPRESSOR Take 0.5 tablets (12.5 mg total) by mouth 2 (two) times daily.   nitroGLYCERIN 0.4 MG SL tablet Commonly known as: NITROSTAT Place 1 tablet (0.4 mg total) under the tongue every 5 (five) minutes as needed for chest pain.   NovoLOG FlexPen 100 UNIT/ML FlexPen Generic drug: insulin aspart Inject 28 Units into the skin 3 (three) times daily with meals.   TRESIBA FLEXTOUCH [place] Inject 60 Units into the skin daily.   Vitamin D (Ergocalciferol) 1.25 MG (50000 UNIT) Caps capsule Commonly known as: DRISDOL Take 50,000 Units by  mouth every 7 (seven) days.        Discharge Exam: Filed Weights   11/26/22 2014  Weight: 87.5 kg    In no acute distress Cardiovascular:     Rate and Rhythm: Normal rate and regular rhythm.     Heart sounds: Normal heart sounds.  Pulmonary:     Effort: Pulmonary effort is normal.     Breath sounds: Normal breath sounds.  Abdominal: General: Bowel sounds are normal. There is  no distension.   Musculoskeletal:     Right lower leg: No edema.     Left lower leg: No edema.  Skin:    General: Skin is warm.  Neurological:     General: No focal deficit present.     Mental Status: He is alert and oriented to person, place, and time.     Cranial Nerves: Cranial nerves 2-12 are intact.  Psychiatric:    Normal mood    Condition at discharge: good     Discharge time spent: 33 minutes  Signed: Loyce Dys, MD [facility] 11/27/2022

## 2022-11-27 NOTE — ED Notes (Signed)
Pt given toothbrush and toothpaste as requested.

## 2022-11-27 NOTE — Assessment & Plan Note (Signed)
Home regimen of diuretic and ace held due to [facility].

## 2022-11-27 NOTE — Assessment & Plan Note (Signed)
Pt described chest soreness with coughing. And d/w him that he is moderate risk for cardiovascular disease and this may be unstable angina presentation .We will admit to tele unit. Cardiology consult.  NTG PRN. Troponin rising mildly , will hold heparin gtt for now and start dvt prophylaxis.  Asa/ prn ngt and morphine.

## 2022-11-27 NOTE — ED Notes (Signed)
Pt breakfast tray delivered by dietary

## 2022-11-28 LAB — HEMOGLOBIN A1C
Hgb A1c MFr Bld: 9.3 % — ABNORMAL HIGH (ref 4.8–5.6)
Mean Plasma Glucose: 220 mg/dL

## 2022-11-28 NOTE — Telephone Encounter (Signed)
Voicemail is full was unable to leave message to schedule follow up

## 2022-12-04 DIAGNOSIS — I10 Essential (primary) hypertension: Secondary | ICD-10-CM | POA: Diagnosis not present

## 2022-12-04 DIAGNOSIS — E1169 Type 2 diabetes mellitus with other specified complication: Secondary | ICD-10-CM | POA: Diagnosis not present

## 2022-12-04 DIAGNOSIS — R600 Localized edema: Secondary | ICD-10-CM | POA: Diagnosis not present

## 2022-12-04 DIAGNOSIS — R0602 Shortness of breath: Secondary | ICD-10-CM | POA: Diagnosis not present

## 2022-12-04 DIAGNOSIS — E1159 Type 2 diabetes mellitus with other circulatory complications: Secondary | ICD-10-CM | POA: Diagnosis not present

## 2022-12-04 DIAGNOSIS — E1122 Type 2 diabetes mellitus with diabetic chronic kidney disease: Secondary | ICD-10-CM | POA: Diagnosis not present

## 2022-12-04 DIAGNOSIS — Z09 Encounter for follow-up examination after completed treatment for conditions other than malignant neoplasm: Secondary | ICD-10-CM | POA: Diagnosis not present

## 2022-12-04 DIAGNOSIS — E876 Hypokalemia: Secondary | ICD-10-CM | POA: Diagnosis not present

## 2022-12-04 DIAGNOSIS — E785 Hyperlipidemia, unspecified: Secondary | ICD-10-CM | POA: Diagnosis not present

## 2022-12-07 ENCOUNTER — Ambulatory Visit: Payer: Medicare HMO

## 2022-12-13 DIAGNOSIS — E1159 Type 2 diabetes mellitus with other circulatory complications: Secondary | ICD-10-CM | POA: Diagnosis not present

## 2022-12-13 DIAGNOSIS — E785 Hyperlipidemia, unspecified: Secondary | ICD-10-CM | POA: Diagnosis not present

## 2022-12-13 DIAGNOSIS — E1129 Type 2 diabetes mellitus with other diabetic kidney complication: Secondary | ICD-10-CM | POA: Diagnosis not present

## 2022-12-13 DIAGNOSIS — Z794 Long term (current) use of insulin: Secondary | ICD-10-CM | POA: Diagnosis not present

## 2022-12-13 DIAGNOSIS — R809 Proteinuria, unspecified: Secondary | ICD-10-CM | POA: Diagnosis not present

## 2022-12-13 DIAGNOSIS — E1169 Type 2 diabetes mellitus with other specified complication: Secondary | ICD-10-CM | POA: Diagnosis not present

## 2022-12-13 DIAGNOSIS — I152 Hypertension secondary to endocrine disorders: Secondary | ICD-10-CM | POA: Diagnosis not present

## 2022-12-13 DIAGNOSIS — E559 Vitamin D deficiency, unspecified: Secondary | ICD-10-CM | POA: Diagnosis not present

## 2022-12-13 NOTE — Progress Notes (Signed)
 HPI: Jordan Jennings is seen today for follow-up diabetes.  He is a 82 y.o. male who was diagnosed with diabetes in the early 2000s.  He was previously seen by Dr. Jadali.  I last saw him in 2/24.  At that time I increased his Novolog .  He says he has been sick and has been having trouble controlling his sugars.    He is currently on U-200 Tresiba 60 units daily.  He also takes 8 units of Novolog  with breakfast, 10 units with lunch/ supper.  He was previously on MTF, but he could not tolerate it from a GI standpoint.  Victoza was too expensive.  He is on the freestyle libre2 for CGMS.  It was downloaded and reviewed.  The average in the last 14 days was 175.  His TIR was 60%.  Review of his diet shows that he avoids concentrated carbs.  He says he has not been exercising much due to the pain but says he plans to start.  He has said he has funny feeling in his feet.  He is concerned about his diabetes.    ROS:  No chest pain.  No shortness of breath.   Medical History: Past Medical History:  Diagnosis Date  . Erectile dysfunction due to arterial insufficiency 01/15/2020  . Herpes zoster ophthalmicus of right eye 04/2015  . Hyperlipidemia associated with type 2 diabetes mellitus  (CMS/HHS-HCC)   . Hypertension   . Hypertension associated with type 2 diabetes mellitus  (CMS/HHS-HCC)   . Low back pain    S/P ESI by [facility] 05/19/2019  . Persistent proteinuria associated with type 2 diabetes mellitus  (CMS/HHS-HCC) 01/15/2020  . Type 2 diabetes mellitus with stage 3a chronic kidney disease (CMS/HHS-HCC)   . Vitamin D deficiency     Surgical History: Past Surgical History:  Procedure Laterality Date  . LAPAROSCOPIC CHOLECYSTECTOMY  2006  . EXTRACTION CATARACT EXTRACAPSULAR W/INSERTION INTRAOCULAR PROSTHESIS Left   . EXTRACTION CATARACT EXTRACAPSULAR W/INSERTION INTRAOCULAR PROSTHESIS Right     Social History:  reports that he has quit smoking. He has never used smokeless tobacco.  He reports current alcohol use. He reports that he does not use drugs.  He is married.   Family History: family history includes Cancer in his maternal grandfather, mother, and sister; Colon cancer in his father; Diabetes type II in his mother, sister, and sister; No Known Problems in his daughter, daughter, daughter, daughter, paternal grandfather, paternal grandmother, sister, and sister; Stroke in his father and sister.  Medications: Current Outpatient Medications  Medication Sig Dispense Refill  . aspirin  81 MG EC tablet Take 81 mg by mouth once daily    . atorvastatin  (LIPITOR) 10 MG tablet TAKE 1 TABLET (10 MG TOTAL) BY MOUTH ONCE DAILY FOR CHOLESTEROL 90 tablet 3  . ergocalciferol, vitamin D2, 1,250 mcg (50,000 unit) capsule TAKE 1 CAPSULE BY MOUTH ONE TIME PER WEEK 12 capsule 4  . hydroCHLOROthiazide (HYDRODIURIL) 25 MG tablet Take 1 tablet (25 mg total) by mouth once daily 30 tablet 11  . insulin  ASPART (NOVOLOG  FLEXPEN U-100 INSULIN ) pen injector (concentration 100 units/mL) INJECT 7 UNITS SUBCUTANEOUSLY THREE TIMES DAILY-WITH BREAKFAST, LUNCH AND DINNER. (Patient taking differently: INJECT SUBCUTANEOUSLY THREE TIMES DAILY-WITH BREAKFAST, LUNCH AND DINNER.  8 with breakfast; 10 with lunch and supper) 15 mL 11  . insulin  DEGLUDEC (TRESIBA FLEXTOUCH U-200) pen injector (concentration 200 units/mL) Inject 60 Units subcutaneously once daily 27 mL 4  . lisinopriL  (ZESTRIL ) 40 MG tablet Take 1 tablet (40  mg total) by mouth once daily 90 tablet 3  . metoprolol  tartrate (LOPRESSOR ) 25 MG tablet Take 12.5 mg by mouth 3 (three) times daily as needed    . nitroGLYcerin  (NITROSTAT ) 0.4 MG SL tablet Place 0.4 mg under the tongue every 5 (five) minutes as needed    . ACCU-CHEK AVIVA PLUS TEST STRP test strip  (Patient not taking: Reported on 12/13/2022)    . ACCU-CHEK SOFTCLIX LANCETS lancets 3 (three) times daily (Patient not taking: Reported on 12/13/2022) 300 each 3  . clotrimazole-betamethasone  (LOTRISONE) 1-0.05 % cream Apply topically 2 (two) times daily (Patient not taking: Reported on 12/04/2022) 45 g 1  . pen needle, diabetic (INSUPEN PEN NEEDLE) 32 gauge x 5/16 needle Use as directed 100 each 12   No current facility-administered medications for this visit.    Allergies: No Known Allergies  Physical Exam: Vitals:   12/13/22 1553  BP: 136/80  Pulse: 60  SpO2: 95%  Weight: 88.9 kg (196 lb)  Height: 170.2 cm (5' 7)    Body mass index is 30.7 kg/m. GENERAL: Pleasant, well-appearing male in no distress.   Foot Exam:  Bilateral feet were examined with socks and shoes removed.  His 10g filament is normal bilaterally.  He has no ulcers but does have callus.  Distal pulses intact.   Physical exam otherwise deferred due to coronavirus precautions.  Labs: 12/05/2005: A1c = 8.2  01/15/2020: MA = 82.1 01/28/2020: A1c = 10.7.  K/Cr/Ca = 4.1/1.0/9.4, glucose = 228.  PTH = 39.  Chol. = 122/184/33/58.  ALT/AST = 26/22.  D = 32.1.   07/14/2020: A1c = 9.0.  Fructosamine=327 (c/w a1c~7.2) 11/17/2020: A1c = 8.7 01/20/2021:  K/Cr/Ca = 3.7/1.1/9.6.  Chol = 131/146/32.8/69.  LFTs nl.   06/22/2021:  A1c = 8.4.  Fructosamine=279 (c/w a1c~6.4).  Glucose=114.  K/Cr/Ca= 3.9/0.9/9.3.  MA=1049.8.  Chol=131/146/32.8/69.  LFTs nl. 09/20/2021:  A1c = 8.6.  K/Cr/Ca= 4.1/1.0/9.6.  MA=1624.1.  LFTs nl.  D=39.7. 01/31/2022:  A1c = 8.8  Fructosamine=284 (c/w A1c~6.5).  TSH=1.79  07/13/2022:  A1c = 7.8. 11/26/2022:  TSH = 3.114.  FT4 = 0.76 11/27/2022:  A1c = 9.3.  K/Cr/Ca = 3.1/1.18/8.9.  Chol = 105/82/30/59.  LFTs nl.    Assessment/Plan: 1.  Diabetes.  His A1c from earlier this month was 9.3, though his recent sugars look better than that. Past fructosamines have suggested better control, though recent sugars suggest the A1c is accurate.  Based on review of his CGM, I will increase his lunch Novolog  dose to 11 units. I encouraged continued lifestyle modifications, including more exercise.   2.   HTN/Microalbuminuria associated with diabetes.  His BP is good today on Lisinopril  40 mg.  His amlodipine was stopped due to swelling.  MA was elevated in 5/23.     3.  HLD associated with diabetes.  His lipid panel from 7/24 looked good on atorvastatin  10 mg daily.  I encouraged him to continue.    4.  Vitamin D Deficiency.  His vitamin D has been low in the past, so he is on 50,000 units of supplementation weekly.   It was normal in 5/23.  I encouraged him to continue.   5.  Neuropathy.  He has funny feeling in his feet. His filament was intermittently abnormal previously but nl by exam today (does have callus).  I filled out DM shoes paperwork in 6/23.   6.  Prophylaxis.  I did a foot exam today.  We last got a copy from  the eye doctor ([facility]) on 12/07/21.  He had no retinopathy.  He says he has been seen recently.  I will try to get a copy of the report.    7.  He will return to clinic in 4-5 months.    This note is partially prepared by Sherrilee Patient, Scribe, in the presence of and acting as the scribe of Isabelle Breaker, MD.     DEBBY LEODIS BREAKER, MD  .

## 2023-01-30 DIAGNOSIS — E1129 Type 2 diabetes mellitus with other diabetic kidney complication: Secondary | ICD-10-CM | POA: Diagnosis not present

## 2023-06-13 NOTE — Progress Notes (Addendum)
 HPI: Jordan Jennings is seen today for follow-up diabetes.  He is a 83 y.o. male who was diagnosed with diabetes in the early 2000s.  He was previously seen by Dr. Jadali.  I last saw him in 7/24.  At that time I increased his Novolog .  He did not do this.  He was in the ER for SOB and says he has had the flu these past 8 weeks.  He is seeing a new pcp tomorrow.  He is currently on U-200 Tresiba 60 units daily.  He also takes 10 units of Novolog  with meals.  He was previously on MTF, but he could not tolerate it from a GI standpoint.  Victoza was too expensive.  He is on the freestyle libre2 for CGMS.  It was downloaded and reviewed.  The average in the last 14 days was 185.  His TIR was 45%.  Review of his diet shows that he avoids concentrated carbs.  He says he has not been exercising much due to the pain but says he plans to start.  He has said he has funny feeling in his feet.  He is concerned about his diabetes.    ROS:  No chest pain.  No shortness of breath.   Medical History: Past Medical History:  Diagnosis Date  . Erectile dysfunction due to arterial insufficiency 01/15/2020  . Herpes zoster ophthalmicus of right eye 04/2015  . Hyperlipidemia associated with type 2 diabetes mellitus  (CMS/HHS-HCC)   . Hypertension   . Hypertension associated with type 2 diabetes mellitus  (CMS/HHS-HCC)   . Low back pain    S/P ESI by [facility] 05/19/2019  . Persistent proteinuria associated with type 2 diabetes mellitus  (CMS/HHS-HCC) 01/15/2020  . Type 2 diabetes mellitus with stage 3a chronic kidney disease (CMS/HHS-HCC)   . Vitamin D deficiency     Surgical History: Past Surgical History:  Procedure Laterality Date  . LAPAROSCOPIC CHOLECYSTECTOMY  2006  . EXTRACTION CATARACT EXTRACAPSULAR W/INSERTION INTRAOCULAR PROSTHESIS Left   . EXTRACTION CATARACT EXTRACAPSULAR W/INSERTION INTRAOCULAR PROSTHESIS Right     Social History:  reports that he has quit smoking. He has never used  smokeless tobacco. He reports current alcohol use. He reports that he does not use drugs.  He is married.   Family History: family history includes Cancer in his maternal grandfather, mother, and sister; Colon cancer in his father; Diabetes type II in his mother, sister, and sister; No Known Problems in his daughter, daughter, daughter, daughter, paternal grandfather, paternal grandmother, sister, and sister; Stroke in his father and sister.  Medications: Current Outpatient Medications  Medication Sig Dispense Refill  . aspirin  81 MG EC tablet Take 81 mg by mouth once daily    . atorvastatin  (LIPITOR) 10 MG tablet TAKE 1 TABLET (10 MG TOTAL) BY MOUTH ONCE DAILY FOR CHOLESTEROL 90 tablet 3  . ergocalciferol, vitamin D2, 1,250 mcg (50,000 unit) capsule TAKE 1 CAPSULE BY MOUTH ONE TIME PER WEEK 12 capsule 4  . insulin  ASPART (NOVOLOG  FLEXPEN U-100 INSULIN ) pen injector (concentration 100 units/mL) Inject 8 units with breakfast; 11 units with lunch and 10 units with supper (Patient taking differently: Inject 10 units with breakfast; 10 units with lunch and 10 units with supper) 30 mL 3  . lisinopriL  (ZESTRIL ) 40 MG tablet Take 1 tablet (40 mg total) by mouth once daily 90 tablet 3  . pen needle, diabetic (INSUPEN PEN NEEDLE) 32 gauge x 5/16 needle Use as directed 100 each 12  . TRESIBA  FLEXTOUCH U-200 pen injector (concentration 200 units/mL) INJECT 60 UNITS UNDER THE SKIN ONCE DAILY 27 mL 3  . ACCU-CHEK AVIVA PLUS TEST STRP test strip  (Patient not taking: Reported on 06/13/2023)    . ACCU-CHEK SOFTCLIX LANCETS lancets 3 (three) times daily (Patient not taking: Reported on 06/13/2023) 300 each 3  . amLODIPine (NORVASC) 10 MG tablet take 1 tablet by mouth every day 90 tablet 0  . hydroCHLOROthiazide (HYDRODIURIL) 25 MG tablet Take 1 tablet (25 mg total) by mouth once daily (Patient not taking: Reported on 06/13/2023) 30 tablet 11  . metoprolol  tartrate (LOPRESSOR ) 25 MG tablet Take 12.5 mg by mouth 3  (three) times daily as needed (Patient not taking: Reported on 06/13/2023)    . nitroGLYcerin  (NITROSTAT ) 0.4 MG SL tablet Place 0.4 mg under the tongue every 5 (five) minutes as needed (Patient not taking: Reported on 06/13/2023)     No current facility-administered medications for this visit.    Allergies: No Known Allergies  Physical Exam: Vitals:   06/13/23 1529  BP: (!) 158/90  Pulse: 67  SpO2: 99%  Height: 170.2 cm (5' 7)     Body mass index is 30.7 kg/m. GENERAL: Pleasant, well-appearing male in no distress.    Physical exam otherwise deferred due to coronavirus precautions.  Labs: 12/05/2005: A1c = 8.2  01/15/2020: MA = 82.1 01/28/2020: A1c = 10.7.  K/Cr/Ca = 4.1/1.0/9.4, glucose = 228.  PTH = 39.  Chol. = 122/184/33/58.  ALT/AST = 26/22.  D = 32.1.   07/14/2020: A1c = 9.0.  Fructosamine=327 (c/w a1c~7.2) 11/17/2020: A1c = 8.7 01/20/2021:  K/Cr/Ca = 3.7/1.1/9.6.  Chol = 131/146/32.8/69.  LFTs nl.   06/22/2021:  A1c = 8.4.  Fructosamine=279 (c/w a1c~6.4).  Glucose=114.  K/Cr/Ca= 3.9/0.9/9.3.  MA=1049.8.  Chol=131/146/32.8/69.  LFTs nl. 09/20/2021:  A1c = 8.6.  K/Cr/Ca= 4.1/1.0/9.6.  MA=1624.1.  LFTs nl.  D=39.7. 01/31/2022:  A1c = 8.8  Fructosamine=284 (c/w A1c~6.5).  TSH=1.79  07/13/2022:  A1c = 7.8. 11/26/2022:  TSH = 3.114.  FT4 = 0.76 11/27/2022:  A1c = 9.3.  K/Cr/Ca = 3.1/1.18/8.9.  Chol = 105/82/30/59.  LFTs nl.    Assessment/Plan: 1.  Diabetes.  A1c was not done today because he did not want to be stuck.  I told him to have his pcp check it with labs tomorrow.  His 14 day average is overall too high.  Based on review of his CGM, I will have him take 11 units of Novolog  with meals. If he starts to exercise, I told him to take 8.  I encouraged continued lifestyle modifications, including more exercise.   2.  HTN/Microalbuminuria associated with diabetes.  His BP is good today on Lisinopril  40 mg.  His amlodipine was stopped due to swelling.  MA was elevated in 5/23. I told him  to have his new pcp check his MA tomorrow with labs.  3.  HLD associated with diabetes.  His lipid panel from 7/24 looked good on atorvastatin  10 mg daily.  I encouraged him to continue.    4.  Vitamin D Deficiency.  His vitamin D has been low in the past, so he is on 50,000 units of supplementation weekly in the past so the fill hx looks like he is not on it currently.  I asked him to have his D level checked with labs when he sees his new pcp tomorrow.    5.  Neuropathy.  He has funny feeling in his feet. His filament was intermittently abnormal  previously but nl by exam in 7/24 (does have callus).  I filled out DM shoes paperwork in 6/23.   6.  Prophylaxis.  I did a foot exam in 7/24.  We last got a copy from the eye doctor ([facility]) on 12/07/21.  He had no retinopathy.  He says he has been seen since but he cannot remember who it is.  I told him to find out to call and have them send me a copy of the report.   7.  He will return to clinic in 4-5 months.  I will review labs from pcp labs tomorrow.  06/14/23:  A1c=9.5.  K/Cr/Ca= 4.1/1.12/9.5.  MA=750 (though u/a shows UTI).  TSH=2.1, FT4=0.99.  D=25.9.  B12=427.    11/13/23:  No show-[place]  Labs received from PCP 03/05/2023: A1c = 9.2.  Cholesterol = 1 110/100/37/54 09/04/2023: A1c = 7.7 K/Cr/Ca = 0.1/0.9./9.2.  LFTs normal 11/19/23:  A1c=9.0.  This note is partially prepared by Sherrilee Patient, Scribe, in the presence of and acting as the scribe of Isabelle Breaker, MD.     DEBBY LEODIS BREAKER, MD  .

## 2024-01-27 NOTE — Progress Notes (Unsigned)
  Cardiology Office Note   Date:  01/28/2024  ID:  Jordan Jennings 23-Jan-1941, MRN [id number] PCP: Odell Chard, Edra GRADE, MD  [name] Cardiologist:  Caron Poser, MD     History of Present Illness Jordan Jennings is a 83 y.o. male PMH DM2, HTN, CKD 3A who presents for further evaluation management of hypertension.  Patient was seen in the ED 11/2022 by the consult team for chest discomfort and a mildly elevated troponin which was flat.  LDL was 59 at that time.  Initial plans had been for discharge from the ED with an outpatient stress MPI; does not look as though this was ever completed.  Patient reports that he is generally feeling well, but has noticed that he is getting out of breath sooner than he used to.  He is still able to do most things without much limitation.  He denies any LE edema that will go away, orthopnea, PND or other signs or symptoms of heart failure.  He denies any chest pain.  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  Studies Reviewed I have independently reviewed the patient's ECG, previous blood work, previous medical records, previous cardiac testing, and prior CT scan from 11/2022.  Physical Exam VS:  BP 128/74 (BP Location: Right Arm, Patient Position: Sitting, Cuff Size: Normal)   Pulse 98   Ht 5' 9 (1.753 m)   Wt 197 lb 3.2 oz (89.4 kg)   SpO2 96%   BMI 29.12 kg/m        Wt Readings from Last 3 Encounters:  01/28/24 197 lb 3.2 oz (89.4 kg)  11/26/22 192 lb 14.4 oz (87.5 kg)  12/26/20 195 lb (88.5 kg)    GEN: No acute distress. NECK: No JVD; No carotid bruits. CARDIAC: RRR, no murmurs, rubs, gallops. RESPIRATORY:  Clear to auscultation. EXTREMITIES:  Warm and  well-perfused. No edema.  ASSESSMENT AND PLAN CAC Aortic arch atherosclerosis DOE Patient presents with progressive DOE which could be an anginal equivalent given his age and risk factors.  He had a mildly elevated and flat troponin during an ED visit last year.  He has significant CAC from a prior CT scan.  We had a shared decision making discussion regarding further noninvasive testing.  I think a stress PET would probably be the best choice for him given his age, calcium  burden, and mildly elevated heart rates, but he would prefer to pursue a coronary CT at this time given concerns over cost.  I discussed that this may be a challenging test to interpret given these limitations.  He did note that even if he does have significant disease, he would probably opt for medical management instead of invasive management, but chose to proceed with further testing despite this.  Plan: - Continue ASA 81 mg daily - Continue Lipitor 40 mg daily - Coronary CT angiogram as above - Echocardiogram to evaluate for structural/valvular causes  HLD Last LDL 59 11/2022.  Well-controlled.  Continue Lipitor 40 mg daily.  LDL goal less than 70  HTN Well-controlled.  Continue HCTZ 25 mg daily, lisinopril  40 mg daily        Dispo: RTC as needed following cardiovascular testing  Signed, Caron Poser, MD

## 2024-01-28 ENCOUNTER — Ambulatory Visit

## 2024-01-28 VITALS — BP 128/74 | HR 98 | Ht 69.0 in | Wt 197.2 lb

## 2024-01-28 DIAGNOSIS — R079 Chest pain, unspecified: Secondary | ICD-10-CM | POA: Diagnosis not present

## 2024-01-28 DIAGNOSIS — I10 Essential (primary) hypertension: Secondary | ICD-10-CM | POA: Diagnosis not present

## 2024-01-28 DIAGNOSIS — R072 Precordial pain: Secondary | ICD-10-CM

## 2024-01-28 DIAGNOSIS — E782 Mixed hyperlipidemia: Secondary | ICD-10-CM

## 2024-01-28 DIAGNOSIS — Z79899 Other long term (current) drug therapy: Secondary | ICD-10-CM

## 2024-01-28 MED ORDER — METOPROLOL TARTRATE 100 MG PO TABS: ORAL_TABLET | ORAL | 0 refills | Status: AC

## 2024-01-28 NOTE — Patient Instructions (Addendum)
 Medication Instructions:   Your physician recommends that you continue on your current medications as directed. Please refer to the Current Medication list given to you today.   *If you need a refill on your cardiac medications before your next appointment, please call your pharmacy*  Lab Work:  Your provider would like for you to have following labs drawn today BMET.   If you have labs (blood work) drawn today and your tests are completely normal, you will receive your results only by: MyChart Message (if you have MyChart) OR A paper copy in the mail If you have any lab test that is abnormal or we need to change your treatment, we will call you to review the results.  Testing/Procedures:   Your cardiac CT will be scheduled at: [facility] [address] [place], [place] [address] [phone]  Please arrive 15 mins early for check-in and test prep.  There is spacious parking and easy access to the radiology department from the [facility] entrance [facility] Parking Available). Please enter here and check-in with the desk attendant.   Please follow these instructions carefully (unless otherwise directed):  An IV will be required for this test and Nitroglycerin  will be given.   On the Night Before the Test: Be sure to Drink plenty of water . Do not consume any caffeinated/decaffeinated beverages or chocolate 12 hours prior to your test. Do not take any antihistamines 12 hours prior to your test.   On the Day of the Test: Drink plenty of water  until 1 hour prior to the test. Do not eat any food 1 hour prior to test. You may take your regular medications prior to the test.  Take metoprolol  (Lopressor ) 100 mg by mouth two hours prior to test. Hydrochlorothiazide - please HOLD on the morning of the test. Patients who wear a continuous glucose monitor MUST remove the device prior to scanning.   After the Test: Drink plenty of water . After receiving  IV contrast, you may experience a mild flushed feeling. This is normal. On occasion, you may experience a mild rash up to 24 hours after the test. This is not dangerous. If this occurs, you can take Benadryl 25 mg, Zyrtec, Claritin, or Allegra and increase your fluid intake. (Patients taking Tikosyn should avoid Benadryl, and may take Zyrtec, Claritin, or Allegra) If you experience trouble breathing, this can be serious. If it is severe call 911 IMMEDIATELY. If it is mild, please call our office.  We will call to schedule your test 2-4 weeks out understanding that some insurance companies will need an authorization prior to the service being performed.   For more information and frequently asked questions, please visit our website : [email or web address]  For non-scheduling related questions, please contact the cardiac imaging nurse navigator should you have any questions/concerns: Cardiac Imaging Nurse Navigators Direct Office Dial: [phone]   For scheduling needs, including cancellations and rescheduling, please call [place], [phone].     Your physician has requested that you have an echocardiogram. Echocardiography is a painless test that uses sound waves to create images of your heart. It provides your doctor with information about the size and shape of your heart and how well your heart's chambers and valves are working.   You may receive an ultrasound enhancing agent through an IV if needed to better visualize your heart during the echo. This procedure takes approximately one hour.  There are no restrictions for this procedure.  This will take place at [address] ([facility] [address], [place] [address]  Please note: We ask at that you not bring children with you during ultrasound (echo/ vascular) testing. Due to room size and safety concerns, children are not allowed in the ultrasound rooms during exams. Our front office staff cannot provide  observation of children in our lobby area while testing is being conducted. An adult accompanying a patient to their appointment will only be allowed in the ultrasound room at the discretion of the ultrasound technician under special circumstances. We apologize for any inconvenience.   Follow-Up: At [facility], you and your health needs are our priority.  As part of our continuing mission to provide you with exceptional heart care, our providers are all part of one team.  This team includes your primary Cardiologist (physician) and Advanced Practice Providers or APPs (Physician Assistants and Nurse Practitioners) who all work together to provide you with the care you need, when you need it.  Your next appointment:   As Needed   Provider:   Caron Poser, MD    We recommend signing up for the patient portal called MyChart.  Sign up information is provided on this After Visit Summary.  MyChart is used to connect with patients for Virtual Visits (Telemedicine).  Patients are able to view lab/test results, encounter notes, upcoming appointments, etc.  Non-urgent messages can be sent to your provider as well.   To learn more about what you can do with MyChart, go to [email or web address].

## 2024-01-29 ENCOUNTER — Ambulatory Visit: Payer: Self-pay

## 2024-01-29 LAB — BASIC METABOLIC PANEL WITH GFR
BUN/Creatinine Ratio: 13 (ref 10–24)
BUN: 15 mg/dL (ref 8–27)
CO2: 24 mmol/L (ref 20–29)
Calcium: 10 mg/dL (ref 8.6–10.2)
Chloride: 104 mmol/L (ref 96–106)
Creatinine, Ser: 1.13 mg/dL (ref 0.76–1.27)
Glucose: 142 mg/dL — ABNORMAL HIGH (ref 70–99)
Potassium: 4 mmol/L (ref 3.5–5.2)
Sodium: 143 mmol/L (ref 134–144)
eGFR: 64 mL/min/1.73 (ref >59)

## 2024-01-30 NOTE — Telephone Encounter (Signed)
 Called pt.  No answer.  Left Detailed message per DPR regarding lab results reviewed by Dr. [place]:  Labs appear appropriate for CT. No further action needed.  Requested pt to give us  a call back at [phone] if he should have any questions.

## 2024-01-30 NOTE — Telephone Encounter (Signed)
-----   Message from [place] sent at 01/29/2024  8:09 AM EDT ----- Labs appear appropriate for CT. No further action needed ----- Message ----- From: Interface, [facility] Lab Results In Sent: 01/29/2024   5:37 AM EDT To: Caron Poser, MD

## 2024-01-31 ENCOUNTER — Encounter: Payer: Self-pay | Admitting: *Deleted

## 2024-01-31 NOTE — Telephone Encounter (Signed)
 Letter sent with lab results on 01/31/2024

## 2024-02-20 ENCOUNTER — Ambulatory Visit

## 2024-03-06 ENCOUNTER — Other Ambulatory Visit

## 2024-04-28 ENCOUNTER — Ambulatory Visit: Admitting: Podiatry

## 2024-04-28 ENCOUNTER — Encounter: Payer: Self-pay | Admitting: Podiatry

## 2024-04-28 VITALS — Ht 69.0 in | Wt 197.2 lb

## 2024-04-28 DIAGNOSIS — L97522 Non-pressure chronic ulcer of other part of left foot with fat layer exposed: Secondary | ICD-10-CM

## 2024-04-28 DIAGNOSIS — E08621 Diabetes mellitus due to underlying condition with foot ulcer: Secondary | ICD-10-CM | POA: Diagnosis not present

## 2024-04-28 NOTE — Progress Notes (Signed)
   Chief Complaint  Patient presents with   Callouses    Pt is here due to callous to the side of the left foot, he states that it has been there for about 3 weeks, area is tender and sore, states that he seen his PCP for this issue and was given a cream to use twice a day.    Subjective:  83 y.o. male with PMHx of diabetes mellitus presenting to the for evaluation of a symptomatic lesion to the plantar aspect of the fifth MTP left foot.  Onset about 3 weeks ago.  No history of injury   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   Hypertension     Past Surgical History:  Procedure Laterality Date   CHOLECYSTECTOMY     COLONOSCOPY     COLONOSCOPY WITH PROPOFOL  N/A 06/01/2016   Procedure: COLONOSCOPY WITH PROPOFOL ;  Surgeon: Rogelia Copping, MD;  Location: [facility];  Service: Endoscopy;  Laterality: N/A;  Diabetic    No Known Allergies   Objective/Physical Exam General: The patient is alert and oriented x3 in no acute distress.  Dermatology:  Wound #1 noted to the plantar lateral aspect of the left forefoot fifth MTP measuring approximately 0.2 x 0.2 x 0.1 cm (LxWxD).   To the noted ulceration(s), there is no eschar. There is a moderate amount of slough, fibrin, and necrotic tissue noted. Granulation tissue and wound base is red. There is a minimal amount of serosanguineous drainage noted. There is no exposed bone muscle-tendon ligament or joint. There is no malodor. Periwound integrity is intact. Skin is warm, dry and supple bilateral lower extremities.  Vascular: Palpable pedal pulses bilaterally. No edema or erythema noted. Capillary refill within normal limits.  Neurological: Grossly intact via light touch  Musculoskeletal Exam: Range of motion within normal limits to all pedal and ankle joints bilateral. Muscle strength 5/5 in all groups bilateral.  Patient very healthy  and ambulatory for his age  Assessment: 1.  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 diabetes mellitus w/ peripheral neuropathy   Plan of Care:  -Patient was evaluated. -Medically necessary excisional debridement including subcutaneous tissue was performed using a tissue nipper and a chisel blade. Excisional debridement of all the necrotic nonviable tissue down to healthy bleeding viable tissue was performed with post-debridement measurements same as pre-. -The wound was cleansed and dry sterile dressing applied. -Recommend triple antibiotic and a Band-Aid daily -Refrain from going barefoot -Return to clinic 4 weeks   Thresa EMERSON Sar, DPM [facility]  Dr. Thresa EMERSON Sar, DPM    [address] [place], [place] [address]                Office [phone]  Fax [phone]

## 2024-06-09 ENCOUNTER — Ambulatory Visit: Admitting: Podiatry
